# Patient Record
Sex: Female | Born: 1950 | Race: White | Hispanic: No | Marital: Single | Smoking: Former smoker
Health system: Southern US, Community
[De-identification: ages and names within clinical notes are randomized; demographics above are authoritative.]

## PROBLEM LIST (undated history)

## (undated) DIAGNOSIS — S82131A Displaced fracture of medial condyle of right tibia, initial encounter for closed fracture: Secondary | ICD-10-CM

## (undated) DIAGNOSIS — E669 Obesity, unspecified: Secondary | ICD-10-CM

## (undated) DIAGNOSIS — S3282XA Multiple fractures of pelvis without disruption of pelvic ring, initial encounter for closed fracture: Secondary | ICD-10-CM

## (undated) DIAGNOSIS — S82841A Displaced bimalleolar fracture of right lower leg, initial encounter for closed fracture: Secondary | ICD-10-CM

## (undated) DIAGNOSIS — M5030 Other cervical disc degeneration, unspecified cervical region: Secondary | ICD-10-CM

## (undated) DIAGNOSIS — S72463A Displaced supracondylar fracture with intracondylar extension of lower end of unspecified femur, initial encounter for closed fracture: Secondary | ICD-10-CM

## (undated) DIAGNOSIS — D649 Anemia, unspecified: Secondary | ICD-10-CM

## (undated) DIAGNOSIS — S92309A Fracture of unspecified metatarsal bone(s), unspecified foot, initial encounter for closed fracture: Secondary | ICD-10-CM

## (undated) HISTORY — PX: ORIF FEMUR FRACTURE: SHX2119

---

## 2011-07-17 DIAGNOSIS — S82841A Displaced bimalleolar fracture of right lower leg, initial encounter for closed fracture: Secondary | ICD-10-CM

## 2011-07-17 DIAGNOSIS — S72463A Displaced supracondylar fracture with intracondylar extension of lower end of unspecified femur, initial encounter for closed fracture: Secondary | ICD-10-CM

## 2011-07-17 DIAGNOSIS — S82131A Displaced fracture of medial condyle of right tibia, initial encounter for closed fracture: Secondary | ICD-10-CM

## 2011-07-17 HISTORY — DX: Displaced bimalleolar fracture of right lower leg, initial encounter for closed fracture: S82.841A

## 2011-07-17 HISTORY — DX: Displaced supracondylar fracture with intracondylar extension of lower end of unspecified femur, initial encounter for closed fracture: S72.463A

## 2011-07-17 HISTORY — DX: Displaced fracture of medial condyle of right tibia, initial encounter for closed fracture: S82.131A

## 2011-07-20 ENCOUNTER — Emergency Department (HOSPITAL_COMMUNITY): Payer: No Typology Code available for payment source

## 2011-07-20 ENCOUNTER — Encounter (HOSPITAL_COMMUNITY): Admission: EM | Disposition: A | Discharge status: Skilled Nursing Facility | Payer: Self-pay | Source: Ambulatory Visit

## 2011-07-20 ENCOUNTER — Encounter (HOSPITAL_COMMUNITY): Payer: Self-pay | Admitting: Certified Registered"

## 2011-07-20 ENCOUNTER — Encounter: Payer: Self-pay | Admitting: Emergency Medicine

## 2011-07-20 ENCOUNTER — Emergency Department (HOSPITAL_COMMUNITY): Payer: No Typology Code available for payment source | Admitting: Certified Registered"

## 2011-07-20 ENCOUNTER — Other Ambulatory Visit: Payer: Self-pay

## 2011-07-20 ENCOUNTER — Inpatient Hospital Stay (HOSPITAL_COMMUNITY)
Admission: EM | Admit: 2011-07-20 | Discharge: 2011-08-13 | DRG: 480 | Disposition: A | Discharge status: Skilled Nursing Facility | Payer: No Typology Code available for payment source | Source: Ambulatory Visit | Attending: General Surgery | Admitting: General Surgery

## 2011-07-20 DIAGNOSIS — S0083XA Contusion of other part of head, initial encounter: Secondary | ICD-10-CM | POA: Diagnosis present

## 2011-07-20 DIAGNOSIS — IMO0002 Reserved for concepts with insufficient information to code with codable children: Secondary | ICD-10-CM | POA: Diagnosis present

## 2011-07-20 DIAGNOSIS — T148XXA Other injury of unspecified body region, initial encounter: Secondary | ICD-10-CM | POA: Diagnosis present

## 2011-07-20 DIAGNOSIS — R600 Localized edema: Secondary | ICD-10-CM

## 2011-07-20 DIAGNOSIS — S42409A Unspecified fracture of lower end of unspecified humerus, initial encounter for closed fracture: Secondary | ICD-10-CM | POA: Diagnosis present

## 2011-07-20 DIAGNOSIS — S82409A Unspecified fracture of shaft of unspecified fibula, initial encounter for closed fracture: Secondary | ICD-10-CM | POA: Diagnosis present

## 2011-07-20 DIAGNOSIS — S82843A Displaced bimalleolar fracture of unspecified lower leg, initial encounter for closed fracture: Secondary | ICD-10-CM | POA: Diagnosis present

## 2011-07-20 DIAGNOSIS — N390 Urinary tract infection, site not specified: Secondary | ICD-10-CM | POA: Diagnosis not present

## 2011-07-20 DIAGNOSIS — S82109A Unspecified fracture of upper end of unspecified tibia, initial encounter for closed fracture: Secondary | ICD-10-CM | POA: Diagnosis present

## 2011-07-20 DIAGNOSIS — S060X9A Concussion with loss of consciousness of unspecified duration, initial encounter: Secondary | ICD-10-CM | POA: Diagnosis present

## 2011-07-20 DIAGNOSIS — S92819A Other fracture of unspecified foot, initial encounter for closed fracture: Secondary | ICD-10-CM

## 2011-07-20 DIAGNOSIS — S81812A Laceration without foreign body, left lower leg, initial encounter: Secondary | ICD-10-CM | POA: Diagnosis present

## 2011-07-20 DIAGNOSIS — H538 Other visual disturbances: Secondary | ICD-10-CM | POA: Diagnosis not present

## 2011-07-20 DIAGNOSIS — S2220XA Unspecified fracture of sternum, initial encounter for closed fracture: Secondary | ICD-10-CM

## 2011-07-20 DIAGNOSIS — S069X9A Unspecified intracranial injury with loss of consciousness of unspecified duration, initial encounter: Secondary | ICD-10-CM

## 2011-07-20 DIAGNOSIS — Z23 Encounter for immunization: Secondary | ICD-10-CM

## 2011-07-20 DIAGNOSIS — S7290XA Unspecified fracture of unspecified femur, initial encounter for closed fracture: Secondary | ICD-10-CM

## 2011-07-20 DIAGNOSIS — S92335A Nondisplaced fracture of third metatarsal bone, left foot, initial encounter for closed fracture: Secondary | ICD-10-CM | POA: Diagnosis present

## 2011-07-20 DIAGNOSIS — S82831A Other fracture of upper and lower end of right fibula, initial encounter for closed fracture: Secondary | ICD-10-CM | POA: Diagnosis present

## 2011-07-20 DIAGNOSIS — S92345A Nondisplaced fracture of fourth metatarsal bone, left foot, initial encounter for closed fracture: Secondary | ICD-10-CM | POA: Diagnosis present

## 2011-07-20 DIAGNOSIS — M5030 Other cervical disc degeneration, unspecified cervical region: Secondary | ICD-10-CM

## 2011-07-20 DIAGNOSIS — R0789 Other chest pain: Secondary | ICD-10-CM | POA: Diagnosis present

## 2011-07-20 DIAGNOSIS — S72351A Displaced comminuted fracture of shaft of right femur, initial encounter for closed fracture: Secondary | ICD-10-CM | POA: Diagnosis present

## 2011-07-20 DIAGNOSIS — S060X1A Concussion with loss of consciousness of 30 minutes or less, initial encounter: Secondary | ICD-10-CM | POA: Diagnosis present

## 2011-07-20 DIAGNOSIS — E669 Obesity, unspecified: Secondary | ICD-10-CM | POA: Diagnosis present

## 2011-07-20 DIAGNOSIS — S0003XA Contusion of scalp, initial encounter: Secondary | ICD-10-CM | POA: Diagnosis present

## 2011-07-20 DIAGNOSIS — S82209A Unspecified fracture of shaft of unspecified tibia, initial encounter for closed fracture: Secondary | ICD-10-CM

## 2011-07-20 DIAGNOSIS — D62 Acute posthemorrhagic anemia: Secondary | ICD-10-CM

## 2011-07-20 DIAGNOSIS — Y998 Other external cause status: Secondary | ICD-10-CM

## 2011-07-20 DIAGNOSIS — S91009A Unspecified open wound, unspecified ankle, initial encounter: Secondary | ICD-10-CM | POA: Diagnosis present

## 2011-07-20 DIAGNOSIS — S92352A Displaced fracture of fifth metatarsal bone, left foot, initial encounter for closed fracture: Secondary | ICD-10-CM | POA: Diagnosis present

## 2011-07-20 DIAGNOSIS — S81009A Unspecified open wound, unspecified knee, initial encounter: Secondary | ICD-10-CM | POA: Diagnosis present

## 2011-07-20 DIAGNOSIS — S82009A Unspecified fracture of unspecified patella, initial encounter for closed fracture: Secondary | ICD-10-CM | POA: Diagnosis present

## 2011-07-20 DIAGNOSIS — S82143A Displaced bicondylar fracture of unspecified tibia, initial encounter for closed fracture: Secondary | ICD-10-CM | POA: Diagnosis present

## 2011-07-20 DIAGNOSIS — S72309A Unspecified fracture of shaft of unspecified femur, initial encounter for closed fracture: Principal | ICD-10-CM | POA: Diagnosis present

## 2011-07-20 DIAGNOSIS — S82841A Displaced bimalleolar fracture of right lower leg, initial encounter for closed fracture: Secondary | ICD-10-CM | POA: Diagnosis present

## 2011-07-20 DIAGNOSIS — M810 Age-related osteoporosis without current pathological fracture: Secondary | ICD-10-CM | POA: Diagnosis present

## 2011-07-20 DIAGNOSIS — S92309A Fracture of unspecified metatarsal bone(s), unspecified foot, initial encounter for closed fracture: Secondary | ICD-10-CM | POA: Diagnosis present

## 2011-07-20 DIAGNOSIS — S069XAA Unspecified intracranial injury with loss of consciousness status unknown, initial encounter: Secondary | ICD-10-CM

## 2011-07-20 DIAGNOSIS — F17200 Nicotine dependence, unspecified, uncomplicated: Secondary | ICD-10-CM | POA: Diagnosis present

## 2011-07-20 DIAGNOSIS — R578 Other shock: Secondary | ICD-10-CM | POA: Diagnosis present

## 2011-07-20 HISTORY — DX: Other cervical disc degeneration, unspecified cervical region: M50.30

## 2011-07-20 HISTORY — PX: I & D EXTREMITY: SHX5045

## 2011-07-20 HISTORY — DX: Anemia, unspecified: D64.9

## 2011-07-20 HISTORY — PX: ORIF FEMUR FRACTURE: SHX2119

## 2011-07-20 HISTORY — DX: Obesity, unspecified: E66.9

## 2011-07-20 LAB — COMPREHENSIVE METABOLIC PANEL
ALT: 17 U/L (ref 0–35)
Alkaline Phosphatase: 52 U/L (ref 39–117)
CO2: 21 mEq/L (ref 19–32)
Chloride: 110 mEq/L (ref 96–112)
GFR calc Af Amer: 76 mL/min — ABNORMAL LOW (ref >90)
Glucose, Bld: 150 mg/dL — ABNORMAL HIGH (ref 70–99)
Potassium: 3.1 mEq/L — ABNORMAL LOW (ref 3.5–5.1)
Sodium: 140 mEq/L (ref 135–145)
Total Bilirubin: 0.2 mg/dL — ABNORMAL LOW (ref 0.3–1.2)
Total Protein: 5.5 g/dL — ABNORMAL LOW (ref 6.0–8.3)

## 2011-07-20 LAB — POCT I-STAT, CHEM 8
BUN: 21 mg/dL (ref 6–23)
Calcium, Ion: 1.01 mmol/L — ABNORMAL LOW (ref 1.12–1.32)
Chloride: 110 mEq/L (ref 96–112)
Creatinine, Ser: 1 mg/dL (ref 0.50–1.10)
Glucose, Bld: 149 mg/dL — ABNORMAL HIGH (ref 70–99)
TCO2: 20 mmol/L (ref 0–100)

## 2011-07-20 LAB — PROTIME-INR
INR: 1.09 (ref 0.00–1.49)
Prothrombin Time: 14.3 s (ref 11.6–15.2)

## 2011-07-20 LAB — CBC
HCT: 33.2 % — ABNORMAL LOW (ref 36.0–46.0)
MCHC: 33.1 g/dL (ref 30.0–36.0)
Platelets: 221 10*3/uL (ref 150–400)
RDW: 12.5 % (ref 11.5–15.5)

## 2011-07-20 LAB — LACTIC ACID, PLASMA: Lactic Acid, Venous: 3.5 mmol/L — ABNORMAL HIGH (ref 0.5–2.2)

## 2011-07-20 LAB — PREPARE RBC (CROSSMATCH)

## 2011-07-20 SURGERY — OPEN REDUCTION INTERNAL FIXATION (ORIF) DISTAL FEMUR FRACTURE
Anesthesia: General | Site: Leg Lower | Laterality: Right | Wound class: Contaminated

## 2011-07-20 MED ORDER — ONDANSETRON HCL 4 MG/2ML IJ SOLN
INTRAMUSCULAR | Status: AC
  Administered 2011-07-20: 4 mg via INTRAVENOUS
  Filled 2011-07-20: qty 2

## 2011-07-20 MED ORDER — TETANUS-DIPHTHERIA TOXOIDS TD 5-2 LFU IM INJ: 0.5000 mL | INJECTION | Freq: Once | INTRAMUSCULAR | Status: DC

## 2011-07-20 MED ORDER — PROPOFOL 10 MG/ML IV EMUL
INTRAVENOUS | Status: DC | PRN
  Administered 2011-07-20: 130 mg via INTRAVENOUS

## 2011-07-20 MED ORDER — ONDANSETRON HCL 4 MG/2ML IJ SOLN
4.0000 mg | Freq: Once | INTRAMUSCULAR | Status: AC
  Administered 2011-07-20: 4 mg via INTRAVENOUS

## 2011-07-20 MED ORDER — CEFAZOLIN SODIUM-DEXTROSE 2-3 GM-% IV SOLR
2.0000 g | Freq: Once | INTRAVENOUS | Status: DC
  Filled 2011-07-20 (×2): qty 50

## 2011-07-20 MED ORDER — FENTANYL CITRATE 0.05 MG/ML IJ SOLN
50.0000 ug | Freq: Once | INTRAMUSCULAR | Status: AC
  Administered 2011-07-20: 50 ug via INTRAVENOUS

## 2011-07-20 MED ORDER — FENTANYL CITRATE 0.05 MG/ML IJ SOLN
INTRAMUSCULAR | Status: AC
  Administered 2011-07-20: 100 ug via INTRAVENOUS
  Filled 2011-07-20: qty 2

## 2011-07-20 MED ORDER — IOHEXOL 300 MG/ML  SOLN
100.0000 mL | Freq: Once | INTRAMUSCULAR | Status: AC | PRN
  Administered 2011-07-20: 100 mL via INTRAVENOUS

## 2011-07-20 MED ORDER — LACTATED RINGERS IV SOLN
INTRAVENOUS | Status: DC | PRN
  Administered 2011-07-20 – 2011-07-21 (×2): via INTRAVENOUS

## 2011-07-20 MED ORDER — SUCCINYLCHOLINE CHLORIDE 20 MG/ML IJ SOLN
INTRAMUSCULAR | Status: DC | PRN
  Administered 2011-07-20: 100 mg via INTRAVENOUS

## 2011-07-20 MED ORDER — SUFENTANIL CITRATE 50 MCG/ML IV SOLN
INTRAVENOUS | Status: DC | PRN
  Administered 2011-07-20: 10 ug via INTRAVENOUS
  Administered 2011-07-20: 20 ug via INTRAVENOUS
  Administered 2011-07-21: 10 ug via INTRAVENOUS

## 2011-07-20 MED ORDER — HETASTARCH-ELECTROLYTES 6 % IV SOLN
INTRAVENOUS | Status: DC | PRN
  Administered 2011-07-20: via INTRAVENOUS

## 2011-07-20 MED ORDER — TETANUS-DIPHTH-ACELL PERTUSSIS 5-2.5-18.5 LF-MCG/0.5 IM SUSP
0.5000 mL | Freq: Once | INTRAMUSCULAR | Status: AC
  Administered 2011-07-20: 0.5 mL via INTRAMUSCULAR
  Filled 2011-07-20: qty 0.5

## 2011-07-20 MED ORDER — FENTANYL CITRATE 0.05 MG/ML IJ SOLN
50.0000 ug | Freq: Once | INTRAMUSCULAR | Status: AC
  Administered 2011-07-20: 100 ug via INTRAVENOUS

## 2011-07-20 SURGICAL SUPPLY — 49 items
BANDAGE CONFORM 2  STR LF (GAUZE/BANDAGES/DRESSINGS) ×12 IMPLANT
BANDAGE CONFORM 2X5YD N/S (GAUZE/BANDAGES/DRESSINGS) ×3 IMPLANT
BANDAGE ELASTIC 6 VELCRO ST LF (GAUZE/BANDAGES/DRESSINGS) ×6 IMPLANT
BIOIMPLANT ORTHADAPT 9CMX10CM (Orthopedic Implant) ×6 IMPLANT
BNDG COHESIVE 4X5 TAN STRL (GAUZE/BANDAGES/DRESSINGS) ×3 IMPLANT
CLAMP 2 3/5OST (Clamp) ×12 IMPLANT
CLAMP 5 HOLE (Clamp) ×6 IMPLANT
CLAMP ROD-ROD (Clamp) ×12 IMPLANT
CLOTH BEACON ORANGE TIMEOUT ST (SAFETY) ×3 IMPLANT
DRAPE C-ARM 42X72 X-RAY (DRAPES) ×3 IMPLANT
DRAPE EXTREMITY T 121X128X90 (DRAPE) ×3 IMPLANT
DRAPE PROXIMA HALF (DRAPES) ×3 IMPLANT
DRILL BIT ×3 IMPLANT
DRSG PAD ABDOMINAL 8X10 ST (GAUZE/BANDAGES/DRESSINGS) ×3 IMPLANT
ELECT CAUTERY BLADE 6.4 (BLADE) ×3 IMPLANT
ELECT REM PT RETURN 9FT ADLT (ELECTROSURGICAL) ×3
ELECTRODE REM PT RTRN 9FT ADLT (ELECTROSURGICAL) ×2 IMPLANT
GAUZE KERLIX 2  STERILE LF (GAUZE/BANDAGES/DRESSINGS) ×3 IMPLANT
GAUZE XEROFORM 1X8 LF (GAUZE/BANDAGES/DRESSINGS) ×9 IMPLANT
GLOVE BIOGEL M 7.0 STRL (GLOVE) ×6 IMPLANT
GLOVE BIOGEL PI IND STRL 7.5 (GLOVE) ×2 IMPLANT
GLOVE BIOGEL PI INDICATOR 7.5 (GLOVE) ×1
GLOVE SURG SS PI 8.0 STRL IVOR (GLOVE) ×6 IMPLANT
GOWN PREVENTION PLUS XLARGE (GOWN DISPOSABLE) ×9 IMPLANT
KIT BASIN OR (CUSTOM PROCEDURE TRAY) ×3 IMPLANT
KIT ROOM TURNOVER OR (KITS) ×3 IMPLANT
MANIFOLD NEPTUNE II (INSTRUMENTS) ×3 IMPLANT
NEEDLE HYPO 25GX1X1/2 BEV (NEEDLE) ×3 IMPLANT
NS IRRIG 1000ML POUR BTL (IV SOLUTION) ×3 IMPLANT
PACK GENERAL/GYN (CUSTOM PROCEDURE TRAY) ×3 IMPLANT
PAD ARMBOARD 7.5X6 YLW CONV (MISCELLANEOUS) ×6 IMPLANT
SCREW BIOMET (Screw) ×6 IMPLANT
SCREW BONE SELF DRILL/TAP5X150 (Screw) ×6 IMPLANT
SPLINT FIBERGLASS 4X30 (CAST SUPPLIES) ×3 IMPLANT
SPONGE GAUZE 4X4 12PLY (GAUZE/BANDAGES/DRESSINGS) ×6 IMPLANT
STAPLER VISISTAT 35W (STAPLE) ×3 IMPLANT
STOCKINETTE IMPERVIOUS 9X36 MD (GAUZE/BANDAGES/DRESSINGS) ×3 IMPLANT
SUT ETHILON 3 0 FSL (SUTURE) ×3 IMPLANT
SUT VIC AB 0 CT1 27 (SUTURE) ×1
SUT VIC AB 0 CT1 27XBRD ANBCTR (SUTURE) ×2 IMPLANT
SUT VIC AB 1 CT1 27 (SUTURE) ×1
SUT VIC AB 1 CT1 27XBRD ANBCTR (SUTURE) ×2 IMPLANT
SUT VIC AB 2-0 CT1 27 (SUTURE) ×1
SUT VIC AB 2-0 CT1 TAPERPNT 27 (SUTURE) ×2 IMPLANT
SYR CONTROL 10ML LL (SYRINGE) ×3 IMPLANT
TOWEL OR 17X24 6PK STRL BLUE (TOWEL DISPOSABLE) ×6 IMPLANT
TOWEL OR 17X26 10 PK STRL BLUE (TOWEL DISPOSABLE) ×3 IMPLANT
UNDERPAD 30X30 INCONTINENT (UNDERPADS AND DIAPERS) ×3 IMPLANT
WATER STERILE IRR 1000ML POUR (IV SOLUTION) ×6 IMPLANT

## 2011-07-20 NOTE — ED Notes (Signed)
Dr Shelle Iron at bedside to assess pt
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
## 2011-07-20 NOTE — ED Notes (Signed)
c-collar chaged for philly collar
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
## 2011-07-20 NOTE — ED Notes (Signed)
Pt in CT, accompanied by RN.

## 2011-07-21 ENCOUNTER — Inpatient Hospital Stay (HOSPITAL_COMMUNITY): Payer: No Typology Code available for payment source

## 2011-07-21 ENCOUNTER — Encounter (HOSPITAL_COMMUNITY): Payer: Self-pay | Admitting: Certified Registered Nurse Anesthetist

## 2011-07-21 DIAGNOSIS — S92345A Nondisplaced fracture of fourth metatarsal bone, left foot, initial encounter for closed fracture: Secondary | ICD-10-CM | POA: Diagnosis present

## 2011-07-21 DIAGNOSIS — S82841A Displaced bimalleolar fracture of right lower leg, initial encounter for closed fracture: Secondary | ICD-10-CM | POA: Diagnosis present

## 2011-07-21 DIAGNOSIS — IMO0002 Reserved for concepts with insufficient information to code with codable children: Secondary | ICD-10-CM | POA: Diagnosis present

## 2011-07-21 LAB — URINALYSIS, MICROSCOPIC ONLY
Bilirubin Urine: NEGATIVE
Leukocytes, UA: NEGATIVE
Nitrite: NEGATIVE
Protein, ur: NEGATIVE mg/dL
Urobilinogen, UA: 0.2 mg/dL (ref 0.0–1.0)

## 2011-07-21 LAB — BASIC METABOLIC PANEL
CO2: 20 mEq/L (ref 19–32)
Chloride: 115 mEq/L — ABNORMAL HIGH (ref 96–112)
Potassium: 3.9 mEq/L (ref 3.5–5.1)
Sodium: 140 mEq/L (ref 135–145)

## 2011-07-21 LAB — CBC
HCT: 22.3 % — ABNORMAL LOW (ref 36.0–46.0)
Hemoglobin: 7.5 g/dL — ABNORMAL LOW (ref 12.0–15.0)
MCV: 91.4 fL (ref 78.0–100.0)
Platelets: 110 10*3/uL — ABNORMAL LOW (ref 150–400)
RBC: 2.44 MIL/uL — ABNORMAL LOW (ref 3.87–5.11)
WBC: 9.3 10*3/uL (ref 4.0–10.5)

## 2011-07-21 LAB — PREPARE RBC (CROSSMATCH)

## 2011-07-21 LAB — MRSA PCR SCREENING: MRSA by PCR: NEGATIVE

## 2011-07-21 MED ORDER — MORPHINE SULFATE (PF) 1 MG/ML IV SOLN
INTRAVENOUS | Status: DC
  Administered 2011-07-22: 1.5 mg via INTRAVENOUS
  Administered 2011-07-22: 3 mg via INTRAVENOUS
  Administered 2011-07-22 (×2): 1 mg via INTRAVENOUS
  Administered 2011-07-23: 3 mg via INTRAVENOUS
  Administered 2011-07-23: 7.5 mg via INTRAVENOUS
  Administered 2011-07-23: 1.5 mg via INTRAVENOUS
  Administered 2011-07-24: 09:00:00 via INTRAVENOUS
  Administered 2011-07-24: 1.5 mg via INTRAVENOUS
  Filled 2011-07-21: qty 25

## 2011-07-21 MED ORDER — HYDROMORPHONE HCL PF 1 MG/ML IJ SOLN: 0.2500 mg | INTRAMUSCULAR | Status: DC | PRN

## 2011-07-21 MED ORDER — ONDANSETRON HCL 4 MG/2ML IJ SOLN: 4.0000 mg | Freq: Four times a day (QID) | INTRAMUSCULAR | Status: DC | PRN

## 2011-07-21 MED ORDER — DEXTROSE-NACL 5-0.45 % IV SOLN
INTRAVENOUS | Status: DC
  Administered 2011-07-21: 125 mL/h via INTRAVENOUS
  Administered 2011-07-21 – 2011-07-22 (×3): via INTRAVENOUS
  Administered 2011-07-24: 1000 mL via INTRAVENOUS
  Administered 2011-07-27 – 2011-07-29 (×3): via INTRAVENOUS

## 2011-07-21 MED ORDER — DEXTROSE 5 % IV SOLN: 1.0000 g | INTRAVENOUS | Status: DC | PRN

## 2011-07-21 MED ORDER — NALOXONE HCL 0.4 MG/ML IJ SOLN: 0.4000 mg | INTRAMUSCULAR | Status: DC | PRN

## 2011-07-21 MED ORDER — BIOTENE DRY MOUTH MT LIQD
15.0000 mL | Freq: Two times a day (BID) | OROMUCOSAL | Status: DC
  Administered 2011-07-21 – 2011-08-11 (×30): 15 mL via OROMUCOSAL

## 2011-07-21 MED ORDER — PANTOPRAZOLE SODIUM 40 MG PO TBEC: 40.0000 mg | DELAYED_RELEASE_TABLET | Freq: Every day | ORAL | Status: DC

## 2011-07-21 MED ORDER — SODIUM CHLORIDE 0.9 % IV SOLN
INTRAVENOUS | Status: DC | PRN
  Administered 2011-07-21: via INTRAVENOUS

## 2011-07-21 MED ORDER — ONDANSETRON HCL 4 MG PO TABS: 4.0000 mg | ORAL_TABLET | Freq: Four times a day (QID) | ORAL | Status: DC | PRN

## 2011-07-21 MED ORDER — ONDANSETRON HCL 4 MG/2ML IJ SOLN
INTRAMUSCULAR | Status: DC | PRN
  Administered 2011-07-21: 4 mg via INTRAVENOUS

## 2011-07-21 MED ORDER — DEXAMETHASONE SODIUM PHOSPHATE 4 MG/ML IJ SOLN
INTRAMUSCULAR | Status: DC | PRN
  Administered 2011-07-21: 4 mg via INTRAVENOUS

## 2011-07-21 MED ORDER — DIPHENHYDRAMINE HCL 50 MG/ML IJ SOLN: 12.5000 mg | Freq: Four times a day (QID) | INTRAMUSCULAR | Status: DC | PRN

## 2011-07-21 MED ORDER — POTASSIUM CHLORIDE 20 MEQ/15ML (10%) PO LIQD
ORAL | Status: AC
  Filled 2011-07-21: qty 15

## 2011-07-21 MED ORDER — DIPHENHYDRAMINE HCL 12.5 MG/5ML PO ELIX
12.5000 mg | ORAL_SOLUTION | Freq: Four times a day (QID) | ORAL | Status: DC | PRN
  Filled 2011-07-21: qty 5

## 2011-07-21 MED ORDER — ENOXAPARIN SODIUM 30 MG/0.3ML ~~LOC~~ SOLN
30.0000 mg | Freq: Two times a day (BID) | SUBCUTANEOUS | Status: DC
  Administered 2011-07-21 (×2): 30 mg via SUBCUTANEOUS
  Filled 2011-07-21 (×5): qty 0.3

## 2011-07-21 MED ORDER — CEFAZOLIN SODIUM-DEXTROSE 2-3 GM-% IV SOLR
2.0000 g | Freq: Three times a day (TID) | INTRAVENOUS | Status: AC
  Administered 2011-07-21 – 2011-07-23 (×6): 2 g via INTRAVENOUS
  Filled 2011-07-21 (×8): qty 50

## 2011-07-21 MED ORDER — HYDROCODONE-ACETAMINOPHEN 5-325 MG PO TABS
1.0000 | ORAL_TABLET | Freq: Four times a day (QID) | ORAL | Status: DC | PRN
  Administered 2011-07-24 – 2011-07-25 (×4): 2 via ORAL
  Administered 2011-07-26: 1 via ORAL
  Administered 2011-07-26: 2 via ORAL
  Administered 2011-07-26: 1 via ORAL
  Administered 2011-07-27: 2 via ORAL
  Administered 2011-07-28: 1 via ORAL
  Administered 2011-07-28: 2 via ORAL
  Administered 2011-07-28: 1 via ORAL
  Administered 2011-07-28 – 2011-08-03 (×8): 2 via ORAL
  Filled 2011-07-21 (×9): qty 2
  Filled 2011-07-21: qty 1
  Filled 2011-07-21: qty 2
  Filled 2011-07-21: qty 1
  Filled 2011-07-21 (×5): qty 2
  Filled 2011-07-21: qty 1
  Filled 2011-07-21 (×2): qty 2

## 2011-07-21 MED ORDER — CEFAZOLIN SODIUM 1-5 GM-% IV SOLN: 1.0000 g | Freq: Once | INTRAVENOUS | Status: DC

## 2011-07-21 MED ORDER — CEFAZOLIN SODIUM-DEXTROSE 2-3 GM-% IV SOLR: 2.0000 g | Freq: Four times a day (QID) | INTRAVENOUS | Status: DC

## 2011-07-21 MED ORDER — MORPHINE SULFATE 4 MG/ML IJ SOLN
4.0000 mg | INTRAMUSCULAR | Status: DC | PRN
  Administered 2011-07-21 – 2011-07-30 (×19): 4 mg via INTRAVENOUS
  Filled 2011-07-21 (×20): qty 1

## 2011-07-21 MED ORDER — PANTOPRAZOLE SODIUM 40 MG IV SOLR
40.0000 mg | Freq: Every day | INTRAVENOUS | Status: DC
  Filled 2011-07-21: qty 40

## 2011-07-21 MED ORDER — ACETAMINOPHEN 325 MG PO TABS
325.0000 mg | ORAL_TABLET | Freq: Four times a day (QID) | ORAL | Status: DC | PRN
  Administered 2011-07-31: 325 mg via ORAL
  Filled 2011-07-21: qty 2

## 2011-07-21 MED ORDER — SODIUM CHLORIDE 0.9 % IJ SOLN: 9.0000 mL | INTRAMUSCULAR | Status: DC | PRN

## 2011-07-21 MED ORDER — CEFAZOLIN SODIUM 1-5 GM-% IV SOLN
INTRAVENOUS | Status: DC | PRN
  Administered 2011-07-20: 2 g via INTRAVENOUS

## 2011-07-21 MED ORDER — PROMETHAZINE HCL 25 MG/ML IJ SOLN: 6.2500 mg | INTRAMUSCULAR | Status: DC | PRN

## 2011-07-21 MED ORDER — EPHEDRINE SULFATE 50 MG/ML IJ SOLN
INTRAMUSCULAR | Status: DC | PRN
  Administered 2011-07-21 (×2): 10 mg via INTRAVENOUS

## 2011-07-21 MED ORDER — POTASSIUM CHLORIDE 20 MEQ/15ML (10%) PO LIQD
ORAL | Status: AC
  Administered 2011-07-21: 20 meq
  Filled 2011-07-21: qty 15

## 2011-07-21 MED ORDER — BUPIVACAINE-EPINEPHRINE PF 0.25-1:200000 % IJ SOLN
INTRAMUSCULAR | Status: DC | PRN
  Administered 2011-07-21: 10 mL

## 2011-07-21 MED ORDER — LACTATED RINGERS IV SOLN: INTRAVENOUS | Status: DC

## 2011-07-21 MED ORDER — MORPHINE SULFATE 4 MG/ML IJ SOLN
3.0000 mg | INTRAMUSCULAR | Status: DC | PRN
  Administered 2011-07-21: 3 mg via INTRAVENOUS
  Filled 2011-07-21 (×2): qty 1
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
## 2011-07-21 NOTE — Progress Notes (Signed)
Pt. With order to transfer 5028. Report given to [facility]. Pt's. V/S stable; with ongoing #1 unit PRBC & D51/2 NS @125ml /r. No c/o made at this time. Transferred.

## 2011-07-21 NOTE — Brief Op Note (Signed)
07/20/2011 - 07/21/2011  1:28 AM  PATIENT:  Regina Rosales  60 y.o. female  PRE-OPERATIVE DIAGNOSIS:  Right distal femur fracture, Right proximal tibia fracture Laceration right calf leftcalf. Right ankle fracture  POST-OPERATIVE DIAGNOSIS:  Closed reduction with applicaton of external fixation and irrigation and debridment of multiple leg wounds and multiple laceration repair and closure.  PROCEDURE:  Procedure(s): OPEN REDUCTION INTERNAL FIXATION (ORIF) DISTAL FEMUR FRACTURE IRRIGATION AND DEBRIDEMENT EXTREMITY  SURGEON:  Surgeon(s): Marshall Cork Dayana Dalporto  PHYSICIAN ASSISTANT:   ASSISTANTS: none   ANESTHESIA:   general  EBL:  Total I/O In: 4850 [I.V.:4000; Blood:350; IV Piggyback:500] Out: 400 [Urine:400]  BLOOD ADMINISTERED:none  DRAINS: none   LOCAL MEDICATIONS USED:  NONE  SPECIMEN:  No Specimen  DISPOSITION OF SPECIMEN:  N/A  COUNTS:  YES  TOURNIQUET:  * No tourniquets in log *  DICTATION: . Number  [id number]  PLAN OF CARE: Admit to inpatient   PATIENT DISPOSITION:  PACU - hemodynamically stable.   Delay start of Pharmacological VTE agent (>24hrs) due to surgical blood loss or risk of bleeding:  {YES/NO/NOT APPLICABLE:20182

## 2011-07-21 NOTE — Progress Notes (Signed)
Transfuse today.    Wilmon Arms. Corliss Skains, MD, [facility] Surgery  07/21/2011 12:20 PM

## 2011-07-21 NOTE — Progress Notes (Signed)
Passed on info of patient's family/friends to patient's nurse in 2900 around 9:10 a.m.

## 2011-07-21 NOTE — Transfer of Care (Signed)
Immediate Anesthesia Transfer of Care Note  Patient: Regina Rosales  Procedure(s) Performed:  OPEN REDUCTION INTERNAL FIXATION (ORIF) DISTAL FEMUR FRACTURE - irrigation of multiple lacerations with reapir; IRRIGATION AND DEBRIDEMENT EXTREMITY - closure of laceration  Patient Location: PACU  Anesthesia Type: General  Level of Consciousness: oriented and sedated  Airway & Oxygen Therapy: Patient Spontanous Breathing and Patient connected to nasal cannula oxygen  Post-op Assessment: Report given to PACU RN, Post -op Vital signs reviewed and stable and Patient moving all extremities  Post vital signs: Reviewed and stable  Complications: No apparent anesthesia complications

## 2011-07-21 NOTE — Anesthesia Postprocedure Evaluation (Signed)
  Anesthesia Post-op Note  Patient: Regina Rosales  Procedure(s) Performed:  OPEN REDUCTION INTERNAL FIXATION (ORIF) DISTAL FEMUR FRACTURE - irrigation of multiple lacerations with reapir; IRRIGATION AND DEBRIDEMENT EXTREMITY - closure of laceration  Patient Location: PACU  Anesthesia Type: General  Level of Consciousness: sedated  Airway and Oxygen Therapy: Patient Spontanous Breathing and Patient connected to nasal cannula oxygen  Post-op Pain: none  Post-op Assessment: Post-op Vital signs reviewed, Patient's Cardiovascular Status Stable, Respiratory Function Stable, Patent Airway, No signs of Nausea or vomiting and Pain level controlled  Post-op Vital Signs: Reviewed and stable  Complications: No apparent anesthesia complications
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
## 2011-07-21 NOTE — Progress Notes (Signed)
Pt. Was instructed on peak flow and achieved a goal of 60 pre Xopenex tx. And a goal of 100  post tx.
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
## 2011-07-22 ENCOUNTER — Inpatient Hospital Stay (HOSPITAL_COMMUNITY): Payer: No Typology Code available for payment source | Admitting: Anesthesiology

## 2011-07-22 ENCOUNTER — Encounter (HOSPITAL_COMMUNITY): Payer: Self-pay | Admitting: Specialist

## 2011-07-22 ENCOUNTER — Encounter (HOSPITAL_COMMUNITY): Payer: Self-pay | Admitting: Anesthesiology

## 2011-07-22 ENCOUNTER — Inpatient Hospital Stay (HOSPITAL_COMMUNITY): Payer: No Typology Code available for payment source

## 2011-07-22 ENCOUNTER — Encounter (HOSPITAL_COMMUNITY): Admission: EM | Disposition: A | Discharge status: Skilled Nursing Facility | Payer: Self-pay | Source: Ambulatory Visit

## 2011-07-22 HISTORY — PX: EXTERNAL FIXATION LEG: SHX1549

## 2011-07-22 LAB — BASIC METABOLIC PANEL
Chloride: 111 mEq/L (ref 96–112)
GFR calc Af Amer: >90 mL/min (ref >90)
GFR calc non Af Amer: >90 mL/min (ref >90)
Potassium: 4 mEq/L (ref 3.5–5.1)
Sodium: 138 mEq/L (ref 135–145)

## 2011-07-22 LAB — TYPE AND SCREEN
Unit division: 0
Unit division: 0

## 2011-07-22 LAB — CBC
HCT: 27.4 % — ABNORMAL LOW (ref 36.0–46.0)
Hemoglobin: 9.4 g/dL — ABNORMAL LOW (ref 12.0–15.0)
MCHC: 34.3 g/dL (ref 30.0–36.0)
RDW: 14.4 % (ref 11.5–15.5)
WBC: 10.8 10*3/uL — ABNORMAL HIGH (ref 4.0–10.5)

## 2011-07-22 SURGERY — EXTERNAL FIXATION, LOWER EXTREMITY
Anesthesia: General | Site: Leg Lower | Laterality: Right | Wound class: Clean

## 2011-07-22 MED ORDER — METOCLOPRAMIDE HCL 5 MG/ML IJ SOLN
INTRAMUSCULAR | Status: DC | PRN
  Administered 2011-07-22: 10 mg via INTRAVENOUS

## 2011-07-22 MED ORDER — DEXTROSE 5 % IV SOLN
INTRAVENOUS | Status: DC | PRN
  Administered 2011-07-22: 17:00:00 via INTRAVENOUS

## 2011-07-22 MED ORDER — HYDROMORPHONE HCL PF 1 MG/ML IJ SOLN: 0.2500 mg | INTRAMUSCULAR | Status: DC | PRN

## 2011-07-22 MED ORDER — GLYCOPYRROLATE 0.2 MG/ML IJ SOLN
INTRAMUSCULAR | Status: DC | PRN
  Administered 2011-07-22: .6 mg via INTRAVENOUS

## 2011-07-22 MED ORDER — SODIUM CHLORIDE 0.9 % IR SOLN
Status: DC | PRN
  Administered 2011-07-22: 1000 mL

## 2011-07-22 MED ORDER — FENTANYL CITRATE 0.05 MG/ML IJ SOLN
INTRAMUSCULAR | Status: DC | PRN
  Administered 2011-07-22: 50 ug via INTRAVENOUS
  Administered 2011-07-22 (×3): 100 ug via INTRAVENOUS
  Administered 2011-07-22: 50 ug via INTRAVENOUS

## 2011-07-22 MED ORDER — PROPOFOL 10 MG/ML IV EMUL
INTRAVENOUS | Status: DC | PRN
  Administered 2011-07-22: 40 mg via INTRAVENOUS
  Administered 2011-07-22: 30 mg via INTRAVENOUS
  Administered 2011-07-22: 130 mg via INTRAVENOUS

## 2011-07-22 MED ORDER — MIDAZOLAM HCL 5 MG/5ML IJ SOLN
INTRAMUSCULAR | Status: DC | PRN
  Administered 2011-07-22: 2 mg via INTRAVENOUS

## 2011-07-22 MED ORDER — DEXAMETHASONE SODIUM PHOSPHATE 4 MG/ML IJ SOLN
INTRAMUSCULAR | Status: DC | PRN
  Administered 2011-07-22: 8 mg via INTRAVENOUS

## 2011-07-22 MED ORDER — ONDANSETRON HCL 4 MG/2ML IJ SOLN
INTRAMUSCULAR | Status: DC | PRN
  Administered 2011-07-22 (×2): 4 mg via INTRAVENOUS

## 2011-07-22 MED ORDER — LACTATED RINGERS IV SOLN
INTRAVENOUS | Status: DC
  Administered 2011-07-22: 15:00:00 via INTRAVENOUS

## 2011-07-22 MED ORDER — ONDANSETRON HCL 4 MG/2ML IJ SOLN: 4.0000 mg | Freq: Once | INTRAMUSCULAR | Status: DC | PRN

## 2011-07-22 MED ORDER — CEFAZOLIN SODIUM 1-5 GM-% IV SOLN
INTRAVENOUS | Status: DC | PRN
  Administered 2011-07-22: 2 g via INTRAVENOUS

## 2011-07-22 MED ORDER — ROCURONIUM BROMIDE 100 MG/10ML IV SOLN
INTRAVENOUS | Status: DC | PRN
  Administered 2011-07-22: 10 mg via INTRAVENOUS
  Administered 2011-07-22: 40 mg via INTRAVENOUS

## 2011-07-22 MED ORDER — LIDOCAINE HCL (CARDIAC) 20 MG/ML IV SOLN
INTRAVENOUS | Status: DC | PRN
  Administered 2011-07-22: 80 mg via INTRAVENOUS

## 2011-07-22 MED ORDER — VECURONIUM BROMIDE 10 MG IV SOLR
INTRAVENOUS | Status: DC | PRN
  Administered 2011-07-22: 4 mg via INTRAVENOUS

## 2011-07-22 MED ORDER — NEOSTIGMINE METHYLSULFATE 1 MG/ML IJ SOLN
INTRAMUSCULAR | Status: DC | PRN
  Administered 2011-07-22: 3 mg via INTRAVENOUS

## 2011-07-22 MED ORDER — PHENYLEPHRINE HCL 10 MG/ML IJ SOLN
10.0000 mg | INTRAMUSCULAR | Status: DC | PRN
  Administered 2011-07-22: 10 ug/min via INTRAVENOUS

## 2011-07-22 MED ORDER — LACTATED RINGERS IV SOLN: INTRAVENOUS | Status: DC

## 2011-07-22 MED ORDER — LACTATED RINGERS IV SOLN
INTRAVENOUS | Status: DC | PRN
  Administered 2011-07-22 (×2): via INTRAVENOUS

## 2011-07-22 MED ORDER — DROPERIDOL 2.5 MG/ML IJ SOLN
INTRAMUSCULAR | Status: DC | PRN
  Administered 2011-07-22: 0.625 mg via INTRAVENOUS

## 2011-07-22 SURGICAL SUPPLY — 80 items
AIRCAST, LARGE RIGHT, ANKLE BRACE ×4 IMPLANT
BANDAGE ELASTIC 4 VELCRO ST LF (GAUZE/BANDAGES/DRESSINGS) ×4 IMPLANT
BANDAGE ELASTIC 6 VELCRO ST LF (GAUZE/BANDAGES/DRESSINGS) IMPLANT
BANDAGE ESMARK 6X9 LF (GAUZE/BANDAGES/DRESSINGS) IMPLANT
BANDAGE GAUZE ELAST BULKY 4 IN (GAUZE/BANDAGES/DRESSINGS) ×4 IMPLANT
BLADE SURG 10 STRL SS (BLADE) IMPLANT
BNDG COHESIVE 4X5 TAN STRL (GAUZE/BANDAGES/DRESSINGS) IMPLANT
BNDG COHESIVE 6X5 TAN STRL LF (GAUZE/BANDAGES/DRESSINGS) IMPLANT
BNDG ELASTIC 6X15 VLCR STRL LF (GAUZE/BANDAGES/DRESSINGS) ×4 IMPLANT
BNDG ESMARK 6X9 LF (GAUZE/BANDAGES/DRESSINGS)
BRUSH SCRUB DISP (MISCELLANEOUS) ×8 IMPLANT
CLEANER TIP ELECTROSURG 2X2 (MISCELLANEOUS) IMPLANT
CLOTH BEACON ORANGE TIMEOUT ST (SAFETY) ×4 IMPLANT
COVER SURGICAL LIGHT HANDLE (MISCELLANEOUS) ×8 IMPLANT
CUFF TOURNIQUET SINGLE 18IN (TOURNIQUET CUFF) IMPLANT
CUFF TOURNIQUET SINGLE 24IN (TOURNIQUET CUFF) IMPLANT
CUFF TOURNIQUET SINGLE 34IN LL (TOURNIQUET CUFF) IMPLANT
DRAPE C-ARM 42X72 X-RAY (DRAPES) ×4 IMPLANT
DRAPE C-ARMOR (DRAPES) ×4 IMPLANT
DRAPE ORTHO SPLIT 77X108 STRL (DRAPES)
DRAPE PROXIMA HALF (DRAPES) IMPLANT
DRAPE SURG ORHT 6 SPLT 77X108 (DRAPES) IMPLANT
DRAPE U-SHAPE 47X51 STRL (DRAPES) ×4 IMPLANT
DRSG ADAPTIC 3X8 NADH LF (GAUZE/BANDAGES/DRESSINGS) ×4 IMPLANT
DRSG EMULSION OIL 3X3 NADH (GAUZE/BANDAGES/DRESSINGS) IMPLANT
DRSG PAD ABDOMINAL 8X10 ST (GAUZE/BANDAGES/DRESSINGS) ×4 IMPLANT
DRSG XEROFORM 1X8 (GAUZE/BANDAGES/DRESSINGS) ×8 IMPLANT
ELECT REM PT RETURN 9FT ADLT (ELECTROSURGICAL) ×4
ELECTRODE REM PT RTRN 9FT ADLT (ELECTROSURGICAL) ×3 IMPLANT
EVACUATOR 1/8 PVC DRAIN (DRAIN) IMPLANT
GAUZE XEROFORM 1X8 LF (GAUZE/BANDAGES/DRESSINGS) ×4 IMPLANT
GLOVE BIO SURGEON STRL SZ7.5 (GLOVE) ×4 IMPLANT
GLOVE BIO SURGEON STRL SZ8 (GLOVE) ×4 IMPLANT
GLOVE BIOGEL PI IND STRL 7.5 (GLOVE) ×3 IMPLANT
GLOVE BIOGEL PI IND STRL 8 (GLOVE) ×3 IMPLANT
GLOVE BIOGEL PI INDICATOR 7.5 (GLOVE) ×1
GLOVE BIOGEL PI INDICATOR 8 (GLOVE) ×1
GOWN PREVENTION PLUS XLARGE (GOWN DISPOSABLE) ×4 IMPLANT
GOWN STRL NON-REIN LRG LVL3 (GOWN DISPOSABLE) ×4 IMPLANT
HANDPIECE INTERPULSE COAX TIP (DISPOSABLE)
KIT BASIN OR (CUSTOM PROCEDURE TRAY) ×4 IMPLANT
KIT REMOVER STAPLE SKIN (MISCELLANEOUS) ×4 IMPLANT
KIT ROOM TURNOVER OR (KITS) ×4 IMPLANT
MANIFOLD NEPTUNE II (INSTRUMENTS) ×4 IMPLANT
NEEDLE 22X1 1/2 (OR ONLY) (NEEDLE) IMPLANT
NEEDLE HYPO 21X1.5 SAFETY (NEEDLE) IMPLANT
NS IRRIG 1000ML POUR BTL (IV SOLUTION) ×4 IMPLANT
PACK GENERAL/GYN (CUSTOM PROCEDURE TRAY) IMPLANT
PACK ORTHO EXTREMITY (CUSTOM PROCEDURE TRAY) ×4 IMPLANT
PAD ARMBOARD 7.5X6 YLW CONV (MISCELLANEOUS) ×8 IMPLANT
PAD CAST 4YDX4 CTTN HI CHSV (CAST SUPPLIES) IMPLANT
PADDING CAST COTTON 4X4 STRL (CAST SUPPLIES)
PADDING CAST COTTON 6X4 STRL (CAST SUPPLIES) IMPLANT
PENCIL BUTTON HOLSTER BLD 10FT (ELECTRODE) IMPLANT
SCREW ×4 IMPLANT
SET HNDPC FAN SPRY TIP SCT (DISPOSABLE) IMPLANT
SPONGE GAUZE 4X4 12PLY (GAUZE/BANDAGES/DRESSINGS) ×12 IMPLANT
SPONGE LAP 18X18 X RAY DECT (DISPOSABLE) ×8 IMPLANT
SPONGE SCRUB IODOPHOR (GAUZE/BANDAGES/DRESSINGS) ×8 IMPLANT
STAPLER VISISTAT 35W (STAPLE) ×4 IMPLANT
STOCKINETTE IMPERVIOUS LG (DRAPES) IMPLANT
STRIP CLOSURE SKIN 1/2X4 (GAUZE/BANDAGES/DRESSINGS) IMPLANT
SUCTION FRAZIER TIP 10 FR DISP (SUCTIONS) ×4 IMPLANT
SUT ETHILON 2 0 FS 18 (SUTURE) IMPLANT
SUT ETHILON 3 0 PS 1 (SUTURE) ×8 IMPLANT
SUT PDS 2 0 (SUTURE) ×4 IMPLANT
SUT PDS AB 2-0 CT1 27 (SUTURE) IMPLANT
SUT VIC AB 0 CT1 27 (SUTURE)
SUT VIC AB 0 CT1 27XBRD ANBCTR (SUTURE) IMPLANT
SUT VIC AB 2-0 CT1 27 (SUTURE)
SUT VIC AB 2-0 CT1 TAPERPNT 27 (SUTURE) IMPLANT
SUT VIC AB 2-0 CT3 27 (SUTURE) IMPLANT
SYR CONTROL 10ML LL (SYRINGE) IMPLANT
TAPE CLOTH SURG 4X10 WHT LF (GAUZE/BANDAGES/DRESSINGS) ×4 IMPLANT
TOWEL OR 17X24 6PK STRL BLUE (TOWEL DISPOSABLE) ×8 IMPLANT
TOWEL OR 17X26 10 PK STRL BLUE (TOWEL DISPOSABLE) ×8 IMPLANT
TUBE CONNECTING 12X1/4 (SUCTIONS) ×4 IMPLANT
UNDERPAD 30X30 INCONTINENT (UNDERPADS AND DIAPERS) ×4 IMPLANT
WATER STERILE IRR 1000ML POUR (IV SOLUTION) ×8 IMPLANT
YANKAUER SUCT BULB TIP NO VENT (SUCTIONS) ×4 IMPLANT

## 2011-07-22 NOTE — Progress Notes (Signed)
Clinical Social Worker received referral for new placement - patient is currently in the OR for an ORIF.  CSW will follow up with patient tomorrow to complete full assessment and discuss possible placement needs.  [address] [place], [place] [phone]

## 2011-07-22 NOTE — Progress Notes (Signed)
Dr Katrinka Blazing here---aware pt needed 8 l o2 by simple mask to get sat. > 90%. Will cont to monitor. xrays done as ord.

## 2011-07-22 NOTE — Transfer of Care (Signed)
Immediate Anesthesia Transfer of Care Note  Patient: Regina Rosales  Procedure(s) Performed:  EXTERNAL FIXATION LEG - external fixation adjustment right leg; DEBRIDEMENT AND CLOSURE WOUND  Patient Location: PACU  Anesthesia Type: General  Level of Consciousness: awake, sedated, patient cooperative and responds to stimulation  Airway & Oxygen Therapy: Patient Spontanous Breathing and Patient connected to face mask oxygen  Post-op Assessment: Report given to PACU RN and Post -op Vital signs reviewed and stable  Post vital signs: Reviewed and stable  Complications: No apparent anesthesia complications

## 2011-07-22 NOTE — Anesthesia Procedure Notes (Signed)
Procedure Name: Intubation Date/Time: 07/22/2011 4:54 PM Performed by: Wray Kearns A Pre-anesthesia Checklist: Patient identified, Timeout performed, Emergency Drugs available, Suction available and Patient being monitored Patient Re-evaluated:Patient Re-evaluated prior to inductionOxygen Delivery Method: Circle System Utilized Preoxygenation: Pre-oxygenation with 100% oxygen Intubation Type: IV induction Ventilation: Mask ventilation without difficulty and Oral airway inserted - appropriate to patient size Laryngoscope Size: Mac and 3 Grade View: Grade I Tube type: Oral Tube size: 7.5 mm Number of attempts: 1 Airway Equipment and Method: stylet Placement Confirmation: ETT inserted through vocal cords under direct vision,  breath sounds checked- equal and bilateral and positive ETCO2 Secured at: 22 cm Tube secured with: Tape Dental Injury: Teeth and Oropharynx as per pre-operative assessment

## 2011-07-22 NOTE — Plan of Care (Signed)
Problem: Phase II Progression Outcomes Goal: Discharge plan established Recommend CIR consult vs SNF for further therapy needs after acute discharge

## 2011-07-22 NOTE — Anesthesia Preprocedure Evaluation (Signed)
Anesthesia Evaluation  Patient identified by MRN, date of birth, ID band Patient awake    Reviewed: Allergy & Precautions, H&P , NPO status , Patient's Chart, lab work & pertinent test results  Airway Mallampati: I TM Distance: >3 FB Neck ROM: Full    Dental   Pulmonary COPD   Pulmonary exam normal       Cardiovascular     Neuro/Psych    GI/Hepatic   Endo/Other    Renal/GU      Musculoskeletal   Abdominal (+) obese,   Peds  Hematology   Anesthesia Other Findings   Reproductive/Obstetrics                           Anesthesia Physical Anesthesia Plan  ASA: II  Anesthesia Plan: General   Post-op Pain Management:    Induction: Intravenous  Airway Management Planned: Oral ETT  Additional Equipment:   Intra-op Plan:   Post-operative Plan: Extubation in OR  Informed Consent: I have reviewed the patients History and Physical, chart, labs and discussed the procedure including the risks, benefits and alternatives for the proposed anesthesia with the patient or authorized representative who has indicated his/her understanding and acceptance.     Plan Discussed with: CRNA and Surgeon  Anesthesia Plan Comments:         Anesthesia Quick Evaluation

## 2011-07-22 NOTE — Progress Notes (Signed)
PT Cancellation Note  Treatment cancelled today due to pt unable to move secondary to increased pain with any movement. History taken from pt. Will attempt evaluation following surgery.  07/22/2011 Milana Kidney DPT PAGER: [phone] OFFICE: [phone]

## 2011-07-22 NOTE — Consult Note (Signed)
I have seen and examined the patient. I agree with the findings above. I discussed with the patient the risks and benefits of surgery, including the possibility of infection, nerve injury, vessel injury, wound breakdown, arthritis, symptomatic hardware, DVT/ PE, loss of motion, and need for further surgery among others.  She wishes to proceed.   Regina Rosales H 07/22/2011 3:56 PM
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
## 2011-07-22 NOTE — Progress Notes (Signed)
This patient has been seen and I agree with the findings and treatment plan.  Very pleasant lady.  Hopefully she will be able to come back to the floor, if not will consider stepdown or ICU.  Currently she is hemodynamically stable and pain is well controlled.  [name]. Gae Bon, MD, FACS [phone] (pager) [phone] (direct pager) Trauma Surgeon

## 2011-07-22 NOTE — Preoperative (Signed)
Beta Blockers   Reason not to administer Beta Blockers:Not Applicable 

## 2011-07-22 NOTE — Anesthesia Postprocedure Evaluation (Signed)
  Anesthesia Post-op Note  Patient: Regina Rosales  Procedure(s) Performed:  EXTERNAL FIXATION LEG - external fixation adjustment right leg; DEBRIDEMENT AND CLOSURE WOUND  Patient Location: PACU  Anesthesia Type: General  Level of Consciousness: awake, alert , oriented and patient cooperative  Airway and Oxygen Therapy: Patient Spontanous Breathing and Patient connected to nasal cannula oxygen  Post-op Pain: mild  Post-op Assessment: Post-op Vital signs reviewed, Patient's Cardiovascular Status Stable, Respiratory Function Stable, Patent Airway, No signs of Nausea or vomiting and Pain level controlled  Post-op Vital Signs: stable  Complications: No apparent anesthesia complications

## 2011-07-22 NOTE — Brief Op Note (Signed)
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  PRE-OPERATIVE DIAGNOSIS:  Left calf wound 2.5 x 2.5cm, ex-fix of right distal femur and right tibial plateau fractures, right ankle bimalleolar fracture, multiple left foot fractures, right patella fracture  POST-OPERATIVE DIAGNOSIS:  Left calf wound 2.5 x 2.5cm, ex-fix of right distal femur and right tibial plateau fractures, right ankle bimalleolar fracture, multiple left foot fractures, right patella fracture  PROCEDURE:  Procedure(s): Revision EXTERNAL FIXATION right leg Z-plasty flap closure left calf Closed reduction of right tibial plateau Closed reduction right distal femur Stress flouro right bimalleolar ankle fracture and syndesmosis  SURGEON:  Surgeon(s): [employer]  PHYSICIAN ASSISTANT: Montez Morita, [facility]   ANESTHESIA:   general  EBL:  Total I/O In: 2100 [I.V.:2100] Out: 1640 [Urine:1640]  BLOOD ADMINISTERED:none  DRAINS: none   LOCAL MEDICATIONS USED:  NONE  SPECIMEN:  No Specimen  DISPOSITION OF SPECIMEN:  N/A  COUNTS:  YES  TOURNIQUET:  * Missing tourniquet times found for documented tourniquets in log:  [id number] *  DICTATION: .Other Dictation: Dictation Number [id number]  PLAN OF CARE: Admit to inpatient   PATIENT DISPOSITION:  PACU - hemodynamically stable.

## 2011-07-23 DIAGNOSIS — M79609 Pain in unspecified limb: Secondary | ICD-10-CM

## 2011-07-23 LAB — CBC
HCT: 24.2 % — ABNORMAL LOW (ref 36.0–46.0)
Hemoglobin: 8.3 g/dL — ABNORMAL LOW (ref 12.0–15.0)
RBC: 2.66 MIL/uL — ABNORMAL LOW (ref 3.87–5.11)
WBC: 9.6 10*3/uL (ref 4.0–10.5)

## 2011-07-23 NOTE — Progress Notes (Signed)
The patient was seen, examined and PA note reviewed and data reviewed.  I agree with the plan of action. 
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
## 2011-07-23 NOTE — Progress Notes (Signed)
Clinical Social Worker completed the psychosocial assessment which can be found in the shadow chart.  Patient is agreeable to skilled nursing facility placement in [facility].  Patient does not want to go to [facility] due to previous family deaths in the facility. Patient with support through friends but limited family support.    Clinical Social Worker has completed SBIRT with patient at bedside.  Patient with no current drug/ETOH use.  Patient very appreciative of support.  CSW will remain available for support and discharge planning needs.  [address] [place], [place] [phone]

## 2011-07-24 DIAGNOSIS — R600 Localized edema: Secondary | ICD-10-CM | POA: Diagnosis not present

## 2011-07-24 LAB — CBC
HCT: 25 % — ABNORMAL LOW (ref 36.0–46.0)
MCHC: 34.8 g/dL (ref 30.0–36.0)
Platelets: 132 10*3/uL — ABNORMAL LOW (ref 150–400)
RDW: 13.7 % (ref 11.5–15.5)
WBC: 9.6 10*3/uL (ref 4.0–10.5)

## 2011-07-24 MED ORDER — MORPHINE SULFATE (PF) 1 MG/ML IV SOLN
INTRAVENOUS | Status: AC
  Administered 2011-07-24: 1.5 mg
  Filled 2011-07-24: qty 25

## 2011-07-24 NOTE — Progress Notes (Signed)
*  PRELIMINARY RESULTS* Right upper extremity venous duplex completed. No evidence of DVT or superficial thrombosis. Mild to moderate interstitial fluid noted through out.Milta Deiters, [place] D 07/24/2011, 4:15 PM
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
## 2011-07-24 NOTE — Progress Notes (Signed)
Physical Therapy Treatment Patient Details Name: Regina Rosales MRN: [id number] DOB: 1951/04/17 Today's Date: 07/24/2011  PT Assessment/Plan  PT - Assessment/Plan Comments on Treatment Session: Pt progressing well. She was able to assist more with movement of LEs as well as increased assist with UEs. Pt still requires assist x 3 for movement throghout transfers. Plan for surgery next week PT Plan: Discharge plan remains appropriate;Frequency remains appropriate PT Frequency: Min 4X/week Follow Up Recommendations: Inpatient Rehab;Skilled nursing facility Equipment Recommended: Defer to next venue PT Goals  Acute Rehab PT Goals PT Goal Formulation: With patient Time For Goal Achievement: 2 weeks PT Goal: Sit to Supine/Side - Progress: Progressing toward goal PT Goal: Stand to Sit - Progress: Progressing toward goal PT Transfer Goal: Bed to Chair/Chair to Bed - Progress: Progressing toward goal PT Goal: Propel Wheelchair - Progress: Other (comment) (unassessed today)  PT Treatment Precautions/Restrictions  Precautions Precautions: Fall Restrictions Weight Bearing Restrictions: Yes RLE Weight Bearing: Non weight bearing LLE Weight Bearing: Non weight bearing Other Position/Activity Restrictions: External fixator Mobility (including Balance) Bed Mobility Bed Mobility: Yes Supine to Sit: 1: +2 Total assist;Patient percentage (comment);With rails;HOB elevated (Comment degrees) (HOB elevated 30 degrees; Pt 50%) Supine to Sit Details (indicate cue type and reason): VC for sequencing. Assist with weight bilateral legs and trunk control. Pt able to assist with movement of LEs as well as assist with UEs for support. Drawsheet used for transition. Sitting - Scoot to Edge of Bed: 1: +2 Total assist;Patient percentage (comment) (30%) Sitting - Scoot to Edge of Bed Details (indicate cue type and reason): VC for sequencing. Assist of bilateral LEs and UE for support. Drawsheet used for scooting  backwards to begin transfer. Transfers Transfers: Yes Anterior-Posterior Transfer: 1: +2 Total assist;Patient percentage (comment) (20%) Anterior-Posterior Transfer Details (indicate cue type and reason): Pt with minimal UE strength to assist with A-P transfer from bed to chair. Pt required assist x 3, two for drawsheet assist and trunk control while another with bilateral LEs. VC for sequencing throughout. Pt able to assist more this session with control of trunk and UEs, although still requires three for support    Exercise    End of Session PT - End of Session Activity Tolerance: Patient tolerated treatment well;Treatment limited secondary to medical complications (Comment) (Bilateral NWB) Patient left: in chair;with call bell in reach (legs on bed) Nurse Communication: Mobility status for transfers;Mobility status for ambulation;Need for lift equipment General Behavior During Session: [facility] for tasks performed Cognition: [facility] for tasks performed  Milana Kidney 07/24/2011, 10:25 AM  07/24/2011 Milana Kidney DPT PAGER: [phone] OFFICE: [phone]

## 2011-07-24 NOTE — Progress Notes (Signed)
Occupational Therapy Treatment Patient Details Name: Regina Rosales MRN: [id number] DOB: [birth year] Today's Date: 07/24/2011  OT Assessment/Plan OT Assessment/Plan OT Plan: Discharge plan remains appropriate OT Frequency: Min 2X/week Follow Up Recommendations: Inpatient Rehab;Skilled nursing facility Equipment Recommended: Defer to next venue OT Goals Acute Rehab OT Goals Time For Goal Achievement: 7 days ADL Goals ADL Goal: Grooming - Progress: Not addressed ADL Goal: Upper Body Bathing - Progress: Not addressed ADL Goal: Upper Body Dressing - Progress: Not addressed Additional ADL Goal #1: progressing toward goals  OT Treatment Precautions/Restrictions  Precautions Precautions: Fall Restrictions Weight Bearing Restrictions: Yes RLE Weight Bearing: Non weight bearing LLE Weight Bearing: Non weight bearing   ADL ADL ADL Comments: transfer with p.t. from bed to chair with ant./post. transfer, also provided level II theraband for ue exercise program Mobility  Bed Mobility Bed Mobility: Yes Supine to Sit: 1: +2 Total assist;HOB elevated (Comment degrees) (pt. 50%) Supine to Sit Details (indicate cue type and reason): inst. cues for sequencing, and assistance with weight for bilateral legs and trunk control, pt. can assist with movement of LES and assistance for ue support, drawsheet used for transition Sitting - Scoot to Edge of Bed: 1: +2 Total assist;Other (comment) (pt. 30%) Sitting - Scoot to Edge of Bed Details (indicate cue type and reason): VC for sequencing. Assist of bilateral LEs and UE for support. Drawsheet used for scooting backwards to begin transfer. Transfers Transfers: Yes Exercises Other Exercises Other Exercises: provided stage II (orange) theraband for ue exercises for pt. to complete throughout the day, pt. able to return demo and agreed to complete at least 2x a day  End of Session OT - End of Session Activity Tolerance: Patient limited by pain Patient  left: in chair;with call bell in reach General Behavior During Session: [facility] for tasks performed Cognition: [facility] for tasks performed  Robet Leu COTA/L 07/24/2011, 12:40 PM

## 2011-07-24 NOTE — Progress Notes (Signed)
Will keep pca per her request, [place] negative for dvt, will switch iv to left arm. Harden Mo
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
## 2011-07-25 MED ORDER — POLYETHYLENE GLYCOL 3350 17 G PO PACK
17.0000 g | PACK | Freq: Every day | ORAL | Status: DC
  Administered 2011-07-25 – 2011-08-13 (×11): 17 g via ORAL
  Filled 2011-07-25 (×20): qty 1

## 2011-07-25 MED ORDER — DOCUSATE SODIUM 100 MG PO CAPS
100.0000 mg | ORAL_CAPSULE | Freq: Two times a day (BID) | ORAL | Status: DC
  Administered 2011-07-25 – 2011-08-13 (×38): 100 mg via ORAL
  Filled 2011-07-25 (×41): qty 1

## 2011-07-25 MED ORDER — METHOCARBAMOL 500 MG PO TABS
500.0000 mg | ORAL_TABLET | Freq: Four times a day (QID) | ORAL | Status: DC | PRN
  Administered 2011-07-25 – 2011-07-26 (×2): 1000 mg via ORAL
  Administered 2011-07-26: 500 mg via ORAL
  Administered 2011-07-26: 1000 mg via ORAL
  Administered 2011-07-26: 500 mg via ORAL
  Administered 2011-07-27: 1000 mg via ORAL
  Administered 2011-07-28 – 2011-07-29 (×6): 500 mg via ORAL
  Administered 2011-07-30 – 2011-07-31 (×2): 1000 mg via ORAL
  Administered 2011-07-31 (×2): 500 mg via ORAL
  Administered 2011-08-01: 1000 mg via ORAL
  Administered 2011-08-01: 500 mg via ORAL
  Administered 2011-08-01: 1000 mg via ORAL
  Administered 2011-08-02 – 2011-08-03 (×3): 500 mg via ORAL
  Administered 2011-08-03 – 2011-08-06 (×6): 1000 mg via ORAL
  Filled 2011-07-25: qty 2
  Filled 2011-07-25: qty 1
  Filled 2011-07-25: qty 2
  Filled 2011-07-25: qty 1
  Filled 2011-07-25 (×2): qty 2
  Filled 2011-07-25: qty 1
  Filled 2011-07-25 (×3): qty 2
  Filled 2011-07-25 (×3): qty 1
  Filled 2011-07-25: qty 2
  Filled 2011-07-25: qty 1
  Filled 2011-07-25: qty 2
  Filled 2011-07-25 (×2): qty 1
  Filled 2011-07-25: qty 2
  Filled 2011-07-25: qty 1
  Filled 2011-07-25: qty 2
  Filled 2011-07-25 (×2): qty 1
  Filled 2011-07-25 (×2): qty 2
  Filled 2011-07-25: qty 1

## 2011-07-25 NOTE — Progress Notes (Signed)
The patient was seen, examined and PA note reviewed and data reviewed.  I agree with the plan of action. 
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
## 2011-07-26 MED ORDER — ENOXAPARIN SODIUM 30 MG/0.3ML ~~LOC~~ SOLN
30.0000 mg | Freq: Two times a day (BID) | SUBCUTANEOUS | Status: DC
  Administered 2011-07-26 – 2011-07-29 (×7): 30 mg via SUBCUTANEOUS
  Filled 2011-07-26 (×9): qty 0.3

## 2011-07-26 MED ORDER — METOCLOPRAMIDE HCL 5 MG/ML IJ SOLN
5.0000 mg | Freq: Three times a day (TID) | INTRAMUSCULAR | Status: DC | PRN
  Filled 2011-07-26: qty 2

## 2011-07-26 MED ORDER — ONDANSETRON HCL 4 MG/2ML IJ SOLN: 4.0000 mg | Freq: Four times a day (QID) | INTRAMUSCULAR | Status: DC | PRN

## 2011-07-26 MED ORDER — METOCLOPRAMIDE HCL 10 MG PO TABS: 5.0000 mg | ORAL_TABLET | Freq: Three times a day (TID) | ORAL | Status: DC | PRN

## 2011-07-26 MED ORDER — ONDANSETRON HCL 4 MG PO TABS: 4.0000 mg | ORAL_TABLET | Freq: Four times a day (QID) | ORAL | Status: DC | PRN
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
## 2011-07-26 NOTE — Progress Notes (Signed)
Clinical Social Worker spoke with patient at bedside to discuss placement options.  Per note in chart, patient not having surgery until next week.  Patient preference is for [facility] at discharge.  CSW contacted facility who states pending bed availability at time of discharge, patient is medically appropriate.  Clinical Social Worker will follow up with patient and facility once patient medically ready for discharge.  [address] [place], [place] [phone]
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
## 2011-07-27 ENCOUNTER — Encounter (HOSPITAL_COMMUNITY): Payer: Self-pay | Admitting: Orthopedic Surgery

## 2011-07-27 LAB — BASIC METABOLIC PANEL
CO2: 26 mEq/L (ref 19–32)
Calcium: 7.9 mg/dL — ABNORMAL LOW (ref 8.4–10.5)
Chloride: 105 mEq/L (ref 96–112)
Glucose, Bld: 111 mg/dL — ABNORMAL HIGH (ref 70–99)
Potassium: 3.8 mEq/L (ref 3.5–5.1)
Sodium: 139 mEq/L (ref 135–145)

## 2011-07-27 LAB — CBC
Hemoglobin: 7.9 g/dL — ABNORMAL LOW (ref 12.0–15.0)
Platelets: 259 10*3/uL (ref 150–400)
RBC: 2.55 MIL/uL — ABNORMAL LOW (ref 3.87–5.11)
WBC: 9 10*3/uL (ref 4.0–10.5)

## 2011-07-27 MED ORDER — FE FUMARATE-B12-VIT C-FA-IFC PO CAPS
1.0000 | ORAL_CAPSULE | Freq: Three times a day (TID) | ORAL | Status: DC
  Administered 2011-07-27 – 2011-08-13 (×49): 1 via ORAL
  Filled 2011-07-27 (×56): qty 1
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
## 2011-07-27 NOTE — Progress Notes (Signed)
Patient examined and I agree with the assessment and plan  Regina Rosales E  

## 2011-07-28 NOTE — Progress Notes (Signed)
Physical Therapy Treatment Patient Details Name: Regina Rosales MRN: [id number] DOB: December 07, 1950 Today's Date: 07/28/2011  PT Assessment/Plan  PT - Assessment/Plan Comments on Treatment Session: Pt progressing well. Treatment limited secondary to pt fearful and tired from previous work with Charity fundraiser and Psychologist, sport and exercise. Pt requested not to transfer to chair, therefore therapy performed into sitting at EOB and bed mobility. Will attempt A-P transfer out and back in bed next session. PT Plan: Discharge plan remains appropriate;Frequency remains appropriate PT Frequency: Min 5X/week Follow Up Recommendations: Inpatient Rehab;Skilled nursing facility Equipment Recommended: Defer to next venue PT Goals  Acute Rehab PT Goals PT Goal Formulation: With patient Time For Goal Achievement: 2 weeks PT Goal: Supine/Side to Sit - Progress: Progressing toward goal PT Goal: Sit to Supine/Side - Progress: Progressing toward goal PT Goal: Stand to Sit - Progress: Discontinued (comment) PT Transfer Goal: Bed to Chair/Chair to Bed - Progress: Progressing toward goal PT Goal: Propel Wheelchair - Progress: Other (comment) (unassessed today)  PT Treatment Precautions/Restrictions  Precautions Precautions: Fall Restrictions Weight Bearing Restrictions: Yes RLE Weight Bearing: Non weight bearing LLE Weight Bearing: Non weight bearing Other Position/Activity Restrictions: External fixator (limits hip flexion to 45 degrees) Mobility (including Balance) Bed Mobility Bed Mobility: Yes Supine to Sit: 1: +2 Total assist;HOB elevated (Comment degrees);Patient percentage (comment) (Pt 25%; HOB 35 degrees) Supine to Sit Details (indicate cue type and reason): VC throughout for sequencing. Pt required mod assist of RLE and minimal assist of LLE as well as minimal trunk control. Pt able to bear weight through UEs to sit in upright posture Sitting - Scoot to Edge of Bed: 3: Mod assist;With rail Sitting - Scoot to [place] of Bed  Details (indicate cue type and reason): Pt able to use rails and vcs for hand placement to scoot to head of bed. Assist of RLE only. Scooting to [facility]: 2: Max assist;With trapeze;With rail (bed in trendelenberg position) Scooting to [facility] Details (indicate cue type and reason): VC for sequencing. Pt used bilateral UE as well as bed in trendelenberg to assist with scooting to head of be  Balance Balance Assessed: Yes Static Sitting Balance Static Sitting - Balance Support: Bilateral upper extremity supported;Feet supported (feet supported by therapist dangling on side of bed) Static Sitting - Level of Assistance: 5: Stand by assistance Static Sitting - Comment/# of Minutes: 5 Exercise  Other Exercises Other Exercises: provided education of tricep exercise and pt return demonstration of bicep End of Session PT - End of Session Activity Tolerance: Patient limited by pain;Treatment limited secondary to medical complications (Comment) (muscle tremors) Patient left: in bed;with call bell in reach;with family/visitor present General Behavior During Session: [facility] for tasks performed Cognition: [facility] for tasks performed  Milana Kidney 07/28/2011, 3:33 PM  07/28/2011 Milana Kidney DPT PAGER: [phone] OFFICE: [phone]

## 2011-07-28 NOTE — Progress Notes (Signed)
This patient has been seen and I agree with the findings and treatment plan.  Georganne Siple O. Nyxon Strupp, III, MD, FACS [phone] (pager) [phone] (direct pager) Trauma Surgeon  
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
## 2011-07-28 NOTE — Progress Notes (Signed)
Occupational Therapy Treatment  Patient Details Name: Regina Rosales MRN: [id number] DOB: 08/28/1950 Today's Date: 07/28/2011  OT Assessment/Plan OT Assessment/Plan Comments on Treatment Session: pt declined OOB to chair anterior/posterior transfer OT Plan: Discharge plan remains appropriate OT Frequency: Min 2X/week Follow Up Recommendations: Inpatient Rehab;Skilled nursing facility Equipment Recommended: Defer to next venue OT Goals Acute Rehab OT Goals OT Goal Formulation: With patient Time For Goal Achievement: 7 days ADL Goals Pt Will Perform Grooming: with set-up;Sitting, edge of bed ADL Goal: Grooming - Progress: Progressing toward goals Pt Will Perform Upper Body Bathing: with set-up;with supervision;Sitting, edge of bed ADL Goal: Upper Body Bathing - Progress: Progressing toward goals Pt Will Perform Upper Body Dressing: with set-up;with supervision;Sitting, bed ADL Goal: Upper Body Dressing - Progress: Progressing toward goals Additional ADL Goal #1: Pt will perform UB exercises x 10-15 minutes to increase strength and activity tolerance for ADLs and functional tasks Miscellaneous OT Goals OT Goal: Miscellaneous Goal #1 - Progress: Progressing toward goals  OT Treatment Precautions/Restrictions  Precautions Precautions: Fall Restrictions Weight Bearing Restrictions: Yes RLE Weight Bearing: Non weight bearing LLE Weight Bearing: Non weight bearing Other Position/Activity Restrictions: External fixator (limits hip flexion to 45 degrees)   ADL ADL Eating/Feeding: Performed;Set up;Other (comment) (RN Bev providing medication) Eating/Feeding Details (indicate cue type and reason): pt with min v/c on taking pills Where Assessed - Eating/Feeding: Bed level Grooming: Not assessed (But could do with SETUP) ADL Comments: pt s/p LB bathing with RN Bev and tech Nettie Elm on arrival. Pt total A for Peri area Mobility  Bed Mobility Bed Mobility: Yes Supine to Sit: 1: +2 Total  assist;HOB elevated (Comment degrees);Patient percentage (comment) (Pt 25%; HOB 35 degrees) Supine to Sit Details (indicate cue type and reason): VC throughout for sequencing. Pt required mod assist of RLE and minimal assist of LLE as well as minimal trunk control. Pt able to bear weight through UEs to sit in upright posture. Pt with bil LE supported at EOB. Pt unable to tolerate BIl LE tangling. Pt using BIL UE to maintain EOB sititng with fair balance Sitting - Scoot to Edge of Bed: 3: Mod assist;With rail Transfers Transfers: No Exercises Other Exercises Other Exercises: provided education of tricep exercise and pt return demonstration of bicep  End of Session OT - End of Session Activity Tolerance: Patient limited by pain (pt with BIL LE muscle spasms) Patient left: in bed;with call bell in reach;with family/visitor present Astronomer) General Behavior During Session: [facility] for tasks performed Cognition: [facility] for tasks performed  Lucile Shutters  07/28/2011, 3:29 PM Pager: [phone]

## 2011-07-28 NOTE — Plan of Care (Signed)
Problem: Phase III Progression Outcomes Goal: Activity at appropriate level-compared to baseline (UP IN CHAIR FOR HEMODIALYSIS)  Outcome: Progressing Pt with BIL LE muscle spasms limiting participation in this session. Pt states " I am just scared but I want to do something" Pt agreeable to EOB sitting.

## 2011-07-29 LAB — CBC
Hemoglobin: 8 g/dL — ABNORMAL LOW (ref 12.0–15.0)
MCH: 31.5 pg (ref 26.0–34.0)
MCV: 97.6 fL (ref 78.0–100.0)
Platelets: 330 10*3/uL (ref 150–400)
RBC: 2.54 MIL/uL — ABNORMAL LOW (ref 3.87–5.11)
WBC: 10.4 10*3/uL (ref 4.0–10.5)

## 2011-07-29 LAB — BASIC METABOLIC PANEL
CO2: 26 mEq/L (ref 19–32)
Calcium: 8.1 mg/dL — ABNORMAL LOW (ref 8.4–10.5)
Chloride: 102 mEq/L (ref 96–112)
Creatinine, Ser: 0.62 mg/dL (ref 0.50–1.10)
Glucose, Bld: 104 mg/dL — ABNORMAL HIGH (ref 70–99)

## 2011-07-29 MED ORDER — DIPHENHYDRAMINE HCL 25 MG PO CAPS
25.0000 mg | ORAL_CAPSULE | Freq: Once | ORAL | Status: AC
  Administered 2011-07-29: 25 mg via ORAL
  Filled 2011-07-29: qty 1

## 2011-07-29 MED ORDER — DIAZEPAM 5 MG/ML IJ SOLN
2.5000 mg | Freq: Three times a day (TID) | INTRAMUSCULAR | Status: DC | PRN
  Administered 2011-07-31 – 2011-08-02 (×4): 2.5 mg via INTRAVENOUS
  Filled 2011-07-29 (×4): qty 2

## 2011-07-29 MED ORDER — ACETAMINOPHEN 325 MG PO TABS
650.0000 mg | ORAL_TABLET | Freq: Once | ORAL | Status: AC
  Administered 2011-07-29: 650 mg via ORAL
  Filled 2011-07-29: qty 2

## 2011-07-29 MED ORDER — ENOXAPARIN SODIUM 30 MG/0.3ML ~~LOC~~ SOLN
30.0000 mg | Freq: Once | SUBCUTANEOUS | Status: AC
  Administered 2011-07-29: 30 mg via SUBCUTANEOUS
  Filled 2011-07-29: qty 0.3

## 2011-07-29 MED ORDER — CEFAZOLIN SODIUM-DEXTROSE 2-3 GM-% IV SOLR
2.0000 g | Freq: Once | INTRAVENOUS | Status: AC
  Administered 2011-07-30: 2 g via INTRAVENOUS
  Filled 2011-07-29: qty 50

## 2011-07-29 NOTE — Progress Notes (Signed)
Physical Therapy Treatment Patient Details Name: Regina Rosales MRN: [id number] DOB: 12/07/1950 Today's Date: 07/29/2011  PT Assessment/Plan  PT - Assessment/Plan Comments on Treatment Session: Pt able to complete supine to sit transfer with only assist of 1 today. Pt still limited by LE movement unassisted although she has increased her UE strength which has improved functional mobility. (Attempt WC transfer after surgery) PT Plan: Discharge plan remains appropriate;Frequency remains appropriate PT Frequency: Min 5X/week Follow Up Recommendations: Inpatient Rehab;Skilled nursing facility Equipment Recommended: Defer to next venue PT Goals  Acute Rehab PT Goals PT Goal Formulation: With patient Time For Goal Achievement: 2 weeks PT Goal: Supine/Side to Sit - Progress: Progressing toward goal PT Goal: Sit to Supine/Side - Progress: Progressing toward goal PT Goal: Stand to Sit - Progress: Discontinued (comment) PT Transfer Goal: Bed to Chair/Chair to Bed - Progress: Other (comment) (unassessed today) Pt will Propel Wheelchair: > 150 feet;with supervision PT Goal: Propel Wheelchair - Progress: Other (comment) (unassessed today)  PT Treatment Precautions/Restrictions  Precautions Precautions: Fall Restrictions Weight Bearing Restrictions: Yes RLE Weight Bearing: Non weight bearing LLE Weight Bearing: Non weight bearing Other Position/Activity Restrictions: External fixator Mobility (including Balance) Bed Mobility Bed Mobility: Yes Rolling Right: 4: Min assist Rolling Right Details (indicate cue type and reason): Assist of LEs while turning. Pt able to use UEs to turn Rolling Left: 4: Min assist Rolling Left Details (indicate cue type and reason): Assist of LEs while turning. Pt able to use UEs to turn Supine to Sit: 3: Mod assist Supine to Sit Details (indicate cue type and reason): Pt required assist of only 1 today with LEs, R>L followed by minimal trunk support. Pt able to use  triceps and trunk control to get into upright sitting posture. Sitting - Scoot to Edge of Bed: 3: Mod assist;With rail Sitting - Scoot to [place] of Bed Details (indicate cue type and reason): VC for hand placement and sequencing for a more upright posture to edge of bed Scooting to [facility]: 2: Max assist;With trapeze;With rail Scooting to [facility] Details (indicate cue type and reason): Assisted pt with drawsheet to head of bed. Pt able to use rails to pull herself up with UEs only. VCs for sequencing Transfers Transfers: No (RN requested not to transfer secondary to blood transfusing)  Balance Balance Assessed: Yes Static Sitting Balance Static Sitting - Balance Support: Bilateral upper extremity supported;Feet unsupported Static Sitting - Level of Assistance: 5: Stand by assistance Static Sitting - Comment/# of Minutes: 10 minutes during functional activity(hair washing) Exercise    End of Session PT - End of Session Activity Tolerance: Patient tolerated treatment well Patient left: in bed;with call bell in reach Nurse Communication: Mobility status for transfers;Mobility status for ambulation;Need for lift equipment General Behavior During Session: [facility] for tasks performed Cognition: [facility] for tasks performed  Milana Kidney 07/29/2011, 3:25 PM  07/29/2011 Milana Kidney DPT PAGER: [phone] OFFICE: [phone]
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
## 2011-07-29 NOTE — Progress Notes (Signed)
For surgery tomorrow.  Still having significant pain.  This patient has been seen and I agree with the findings and treatment plan.  [name]. Gae Bon, MD, FACS [phone] (pager) [phone] (direct pager) Trauma Surgeon
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
## 2011-07-30 ENCOUNTER — Inpatient Hospital Stay (HOSPITAL_COMMUNITY): Payer: No Typology Code available for payment source

## 2011-07-30 ENCOUNTER — Encounter (HOSPITAL_COMMUNITY): Payer: Self-pay | Admitting: Anesthesiology

## 2011-07-30 ENCOUNTER — Encounter (HOSPITAL_COMMUNITY): Admission: EM | Disposition: A | Discharge status: Skilled Nursing Facility | Payer: Self-pay | Source: Ambulatory Visit

## 2011-07-30 ENCOUNTER — Inpatient Hospital Stay (HOSPITAL_COMMUNITY): Payer: No Typology Code available for payment source | Admitting: Anesthesiology

## 2011-07-30 HISTORY — PX: ORIF TIBIA PLATEAU: SHX2132

## 2011-07-30 HISTORY — PX: FEMUR IM NAIL: SHX1597

## 2011-07-30 LAB — CBC
MCH: 30.5 pg (ref 26.0–34.0)
MCHC: 32.1 g/dL (ref 30.0–36.0)
MCV: 95.1 fL (ref 78.0–100.0)
Platelets: 370 10*3/uL (ref 150–400)
RDW: 16.5 % — ABNORMAL HIGH (ref 11.5–15.5)
WBC: 13 10*3/uL — ABNORMAL HIGH (ref 4.0–10.5)

## 2011-07-30 LAB — BASIC METABOLIC PANEL
BUN: 12 mg/dL (ref 6–23)
Calcium: 8.3 mg/dL — ABNORMAL LOW (ref 8.4–10.5)
Creatinine, Ser: 0.66 mg/dL (ref 0.50–1.10)
GFR calc Af Amer: >90 mL/min (ref >90)
GFR calc non Af Amer: >90 mL/min (ref >90)

## 2011-07-30 SURGERY — INSERTION, INTRAMEDULLARY ROD, FEMUR, RETROGRADE
Anesthesia: General | Site: Leg Upper | Laterality: Right | Wound class: Clean

## 2011-07-30 MED ORDER — DEXTROSE 5 % IV SOLN
INTRAVENOUS | Status: DC | PRN
  Administered 2011-07-30: 13:00:00 via INTRAVENOUS

## 2011-07-30 MED ORDER — METHOCARBAMOL 100 MG/ML IJ SOLN
1000.0000 mg | INTRAMUSCULAR | Status: DC
  Filled 2011-07-30: qty 10

## 2011-07-30 MED ORDER — LIDOCAINE HCL (CARDIAC) 20 MG/ML IV SOLN
INTRAVENOUS | Status: DC | PRN
  Administered 2011-07-30: 60 mg via INTRAVENOUS

## 2011-07-30 MED ORDER — ACETAMINOPHEN 10 MG/ML IV SOLN
INTRAVENOUS | Status: DC | PRN
  Administered 2011-07-30: 1000 mg via INTRAVENOUS

## 2011-07-30 MED ORDER — CEFAZOLIN SODIUM 1-5 GM-% IV SOLN
1.0000 g | Freq: Four times a day (QID) | INTRAVENOUS | Status: AC
  Administered 2011-07-30 – 2011-07-31 (×3): 1 g via INTRAVENOUS
  Filled 2011-07-30 (×3): qty 50

## 2011-07-30 MED ORDER — DROPERIDOL 2.5 MG/ML IJ SOLN
INTRAMUSCULAR | Status: DC | PRN
  Administered 2011-07-30: 0.625 mg via INTRAVENOUS

## 2011-07-30 MED ORDER — PROMETHAZINE HCL 25 MG/ML IJ SOLN: 6.2500 mg | INTRAMUSCULAR | Status: DC | PRN

## 2011-07-30 MED ORDER — METOCLOPRAMIDE HCL 10 MG PO TABS: 5.0000 mg | ORAL_TABLET | Freq: Three times a day (TID) | ORAL | Status: DC | PRN

## 2011-07-30 MED ORDER — FENTANYL CITRATE 0.05 MG/ML IJ SOLN
INTRAMUSCULAR | Status: DC | PRN
  Administered 2011-07-30 (×3): 50 ug via INTRAVENOUS
  Administered 2011-07-30: 250 ug via INTRAVENOUS
  Administered 2011-07-30: 50 ug via INTRAVENOUS
  Administered 2011-07-30: 100 ug via INTRAVENOUS
  Administered 2011-07-30: 50 ug via INTRAVENOUS
  Administered 2011-07-30 (×2): 100 ug via INTRAVENOUS

## 2011-07-30 MED ORDER — PROPOFOL 10 MG/ML IV EMUL
INTRAVENOUS | Status: DC | PRN
  Administered 2011-07-30: 20 mg via INTRAVENOUS
  Administered 2011-07-30: 110 mg via INTRAVENOUS
  Administered 2011-07-30: 20 mg via INTRAVENOUS

## 2011-07-30 MED ORDER — LACTATED RINGERS IV SOLN
INTRAVENOUS | Status: DC | PRN
  Administered 2011-07-30 (×3): via INTRAVENOUS

## 2011-07-30 MED ORDER — NALOXONE HCL 0.4 MG/ML IJ SOLN: 0.4000 mg | INTRAMUSCULAR | Status: DC | PRN

## 2011-07-30 MED ORDER — METOCLOPRAMIDE HCL 5 MG/ML IJ SOLN
5.0000 mg | Freq: Three times a day (TID) | INTRAMUSCULAR | Status: DC | PRN
  Filled 2011-07-30: qty 2

## 2011-07-30 MED ORDER — METHOCARBAMOL 100 MG/ML IJ SOLN
1000.0000 mg | INTRAVENOUS | Status: DC
  Filled 2011-07-30: qty 10

## 2011-07-30 MED ORDER — VECURONIUM BROMIDE 10 MG IV SOLR
INTRAVENOUS | Status: DC | PRN
  Administered 2011-07-30 (×2): 2 mg via INTRAVENOUS
  Administered 2011-07-30: 3 mg via INTRAVENOUS

## 2011-07-30 MED ORDER — HYDROMORPHONE HCL PF 1 MG/ML IJ SOLN
0.2500 mg | INTRAMUSCULAR | Status: DC | PRN
  Administered 2011-07-30 (×2): 0.5 mg via INTRAVENOUS

## 2011-07-30 MED ORDER — ONDANSETRON HCL 4 MG/2ML IJ SOLN
INTRAMUSCULAR | Status: DC | PRN
  Administered 2011-07-30 (×2): 4 mg via INTRAVENOUS

## 2011-07-30 MED ORDER — METHOCARBAMOL 100 MG/ML IJ SOLN: 1000.0000 mg | Freq: Once | INTRAMUSCULAR | Status: DC

## 2011-07-30 MED ORDER — MIDAZOLAM HCL 5 MG/5ML IJ SOLN
INTRAMUSCULAR | Status: DC | PRN
  Administered 2011-07-30 (×2): 1 mg via INTRAVENOUS

## 2011-07-30 MED ORDER — LACTATED RINGERS IV SOLN
INTRAVENOUS | Status: DC
  Administered 2011-08-01 (×2): via INTRAVENOUS

## 2011-07-30 MED ORDER — 0.9 % SODIUM CHLORIDE (POUR BTL) OPTIME
TOPICAL | Status: DC | PRN
  Administered 2011-07-30: 1000 mL

## 2011-07-30 MED ORDER — NEOSTIGMINE METHYLSULFATE 1 MG/ML IJ SOLN
INTRAMUSCULAR | Status: DC | PRN
  Administered 2011-07-30: 4 mg via INTRAVENOUS

## 2011-07-30 MED ORDER — ONDANSETRON HCL 4 MG PO TABS: 4.0000 mg | ORAL_TABLET | Freq: Four times a day (QID) | ORAL | Status: DC | PRN

## 2011-07-30 MED ORDER — MORPHINE SULFATE (PF) 1 MG/ML IV SOLN
INTRAVENOUS | Status: DC
  Administered 2011-07-30: 18:00:00 via INTRAVENOUS
  Administered 2011-07-31: 0.05 mg via INTRAVENOUS
  Administered 2011-07-31: 2 mg via INTRAVENOUS

## 2011-07-30 MED ORDER — ACETAMINOPHEN 10 MG/ML IV SOLN
INTRAVENOUS | Status: AC
  Filled 2011-07-30: qty 100

## 2011-07-30 MED ORDER — HETASTARCH-ELECTROLYTES 6 % IV SOLN
INTRAVENOUS | Status: DC | PRN
  Administered 2011-07-30 (×2): via INTRAVENOUS

## 2011-07-30 MED ORDER — FENTANYL CITRATE 0.05 MG/ML IJ SOLN
50.0000 ug | INTRAMUSCULAR | Status: DC | PRN
  Administered 2011-07-30: 100 ug via INTRAVENOUS

## 2011-07-30 MED ORDER — LACTATED RINGERS IV SOLN
INTRAVENOUS | Status: DC
  Administered 2011-07-30 (×2): via INTRAVENOUS

## 2011-07-30 MED ORDER — ENOXAPARIN SODIUM 30 MG/0.3ML ~~LOC~~ SOLN
30.0000 mg | Freq: Two times a day (BID) | SUBCUTANEOUS | Status: DC
  Administered 2011-07-30 – 2011-08-04 (×11): 30 mg via SUBCUTANEOUS
  Filled 2011-07-30 (×15): qty 0.3

## 2011-07-30 MED ORDER — LACTATED RINGERS IV SOLN: INTRAVENOUS | Status: DC | PRN

## 2011-07-30 MED ORDER — DEXAMETHASONE SODIUM PHOSPHATE 4 MG/ML IJ SOLN
INTRAMUSCULAR | Status: DC | PRN
  Administered 2011-07-30: 6 mg via INTRAVENOUS

## 2011-07-30 MED ORDER — MIDAZOLAM HCL 2 MG/2ML IJ SOLN
1.0000 mg | INTRAMUSCULAR | Status: DC | PRN
  Administered 2011-07-30: 2 mg via INTRAVENOUS

## 2011-07-30 MED ORDER — GLYCOPYRROLATE 0.2 MG/ML IJ SOLN
INTRAMUSCULAR | Status: DC | PRN
  Administered 2011-07-30: .7 mg via INTRAVENOUS

## 2011-07-30 MED ORDER — ONDANSETRON HCL 4 MG/2ML IJ SOLN: 4.0000 mg | Freq: Four times a day (QID) | INTRAMUSCULAR | Status: DC | PRN

## 2011-07-30 MED ORDER — FENTANYL CITRATE 0.05 MG/ML IJ SOLN
INTRAMUSCULAR | Status: AC
  Filled 2011-07-30: qty 2

## 2011-07-30 MED ORDER — DIPHENHYDRAMINE HCL 50 MG/ML IJ SOLN: 12.5000 mg | Freq: Four times a day (QID) | INTRAMUSCULAR | Status: DC | PRN

## 2011-07-30 MED ORDER — MIDAZOLAM HCL 2 MG/2ML IJ SOLN
INTRAMUSCULAR | Status: AC
  Filled 2011-07-30: qty 2

## 2011-07-30 MED ORDER — DIPHENHYDRAMINE HCL 12.5 MG/5ML PO ELIX
12.5000 mg | ORAL_SOLUTION | Freq: Four times a day (QID) | ORAL | Status: DC | PRN
  Filled 2011-07-30: qty 5

## 2011-07-30 MED ORDER — SODIUM CHLORIDE 0.9 % IJ SOLN: 9.0000 mL | INTRAMUSCULAR | Status: DC | PRN

## 2011-07-30 MED ORDER — MEPERIDINE HCL 25 MG/ML IJ SOLN: 6.2500 mg | INTRAMUSCULAR | Status: DC | PRN

## 2011-07-30 MED ORDER — ROCURONIUM BROMIDE 100 MG/10ML IV SOLN
INTRAVENOUS | Status: DC | PRN
  Administered 2011-07-30: 50 mg via INTRAVENOUS

## 2011-07-30 MED ORDER — OXYCODONE HCL 5 MG PO TABS
5.0000 mg | ORAL_TABLET | ORAL | Status: DC | PRN
  Administered 2011-07-31 – 2011-08-03 (×9): 10 mg via ORAL
  Filled 2011-07-30 (×9): qty 2

## 2011-07-30 SURGICAL SUPPLY — 83 items
4.5MM CROWE POINT TWIST DRILL ×4 IMPLANT
9x340mm retrograde femoral nai ×4 IMPLANT
BANDAGE ELASTIC 4 VELCRO ST LF (GAUZE/BANDAGES/DRESSINGS) ×4 IMPLANT
BANDAGE ELASTIC 6 VELCRO ST LF (GAUZE/BANDAGES/DRESSINGS) ×8 IMPLANT
BANDAGE ESMARK 6X9 LF (GAUZE/BANDAGES/DRESSINGS) IMPLANT
BANDAGE GAUZE ELAST BULKY 4 IN (GAUZE/BANDAGES/DRESSINGS) ×8 IMPLANT
BIT DRILL 100X2.5XANTM LCK (BIT) ×3 IMPLANT
BIT DRILL CAL (BIT) ×3 IMPLANT
BIT DRILL CALIBRATED 4.3MMX365 (DRILL) ×3 IMPLANT
BIT DRILL CROWE PNT TWST 4.5MM (DRILL) ×3 IMPLANT
BIT DRL 100X2.5XANTM LCK (BIT) ×3
BLADE SURG 10 STRL SS (BLADE) ×4 IMPLANT
BNDG COHESIVE 4X5 TAN STRL (GAUZE/BANDAGES/DRESSINGS) IMPLANT
BNDG ESMARK 6X9 LF (GAUZE/BANDAGES/DRESSINGS)
BRUSH SCRUB DISP (MISCELLANEOUS) ×8 IMPLANT
CALIBRATED DRIL 4.3MMX365MM ×4 IMPLANT
CLOTH BEACON ORANGE TIMEOUT ST (SAFETY) ×4 IMPLANT
COVER MAYO STAND STRL (DRAPES) IMPLANT
COVER SURGICAL LIGHT HANDLE (MISCELLANEOUS) ×8 IMPLANT
DRAPE C-ARM 42X72 X-RAY (DRAPES) ×4 IMPLANT
DRAPE C-ARMOR (DRAPES) ×4 IMPLANT
DRAPE INCISE IOBAN 66X45 STRL (DRAPES) IMPLANT
DRAPE ORTHO SPLIT 77X108 STRL (DRAPES) ×2
DRAPE SURG ORHT 6 SPLT 77X108 (DRAPES) ×6 IMPLANT
DRAPE U-SHAPE 47X51 STRL (DRAPES) ×4 IMPLANT
DRILL BIT 2.5MM (BIT) ×1
DRILL BIT CAL (BIT) ×4
DRILL CALIBRATED 4.3MMX365 (DRILL) ×4
DRILL CROWE POINT TWIST 4.5MM (DRILL) ×4
DRSG ADAPTIC 3X8 NADH LF (GAUZE/BANDAGES/DRESSINGS) ×4 IMPLANT
ELECT CAUTERY BLADE 6.4 (BLADE) ×4 IMPLANT
ELECT REM PT RETURN 9FT ADLT (ELECTROSURGICAL) ×4
ELECTRODE REM PT RTRN 9FT ADLT (ELECTROSURGICAL) ×3 IMPLANT
EVACUATOR 1/8 PVC DRAIN (DRAIN) IMPLANT
GAUZE XEROFORM 1X8 LF (GAUZE/BANDAGES/DRESSINGS) IMPLANT
GLOVE BIO SURGEON STRL SZ7.5 (GLOVE) ×8 IMPLANT
GLOVE BIO SURGEON STRL SZ8 (GLOVE) ×8 IMPLANT
GLOVE BIOGEL PI IND STRL 7.5 (GLOVE) ×3 IMPLANT
GLOVE BIOGEL PI IND STRL 8 (GLOVE) ×3 IMPLANT
GLOVE BIOGEL PI INDICATOR 7.5 (GLOVE) ×1
GLOVE BIOGEL PI INDICATOR 8 (GLOVE) ×1
GOWN PREVENTION PLUS XLARGE (GOWN DISPOSABLE) ×4 IMPLANT
GOWN STRL NON-REIN LRG LVL3 (GOWN DISPOSABLE) ×8 IMPLANT
GUIDEWIRE BEAD TIP (WIRE) ×4 IMPLANT
GUIDEWIRE LAGSCREW 3.2X460 (WIRE) ×4 IMPLANT
K-WIRE ACE 1.6X6 (WIRE) ×12
KIT BASIN OR (CUSTOM PROCEDURE TRAY) ×4 IMPLANT
KIT ROOM TURNOVER OR (KITS) ×4 IMPLANT
KWIRE ACE 1.6X6 (WIRE) ×9 IMPLANT
LAG SCREW GUIDE WIRE 3.2X460MM ×4 IMPLANT
PACK GENERAL/GYN (CUSTOM PROCEDURE TRAY) ×4 IMPLANT
PACK ORTHO EXTREMITY (CUSTOM PROCEDURE TRAY) IMPLANT
PAD ARMBOARD 7.5X6 YLW CONV (MISCELLANEOUS) ×8 IMPLANT
PIN THREADED GUIDE ACE (PIN) ×8 IMPLANT
PLATE LOCK 5H STD RT PROX TIB (Plate) ×4 IMPLANT
SCREW CORT TI DBL LEAD 5X30 (Screw) ×8 IMPLANT
SCREW CORT TI DBL LEAD 5X65 (Screw) ×4 IMPLANT
SCREW CORT TI DBL LEAD 5X70 (Screw) ×4 IMPLANT
SCREW CORT TI DBL LEAD 5X75 (Screw) ×4 IMPLANT
SCREW CORT TI DBL LEAD 5X80 (Screw) ×4 IMPLANT
SCREW CORT TI DBL LEAD 5X85 (Screw) ×4 IMPLANT
SCREW CORTICAL 3.5MM  28MM (Screw) ×1 IMPLANT
SCREW CORTICAL 3.5MM 28MM (Screw) ×3 IMPLANT
SCREW LOCK CORT STAR 3.5X18 (Screw) ×8 IMPLANT
SCREW LOCK CORT STAR 3.5X30 (Screw) ×8 IMPLANT
SCREW LOCK CORT STAR 3.5X65 (Screw) ×12 IMPLANT
SCREW LOCK CORT STAR 3.5X70 (Screw) ×4 IMPLANT
SCREW LOCK CORT STAR 3.5X75 (Screw) ×8 IMPLANT
SPONGE GAUZE 4X4 12PLY (GAUZE/BANDAGES/DRESSINGS) ×4 IMPLANT
SPONGE LAP 18X18 X RAY DECT (DISPOSABLE) ×4 IMPLANT
STAPLER VISISTAT 35W (STAPLE) ×4 IMPLANT
STOCKINETTE IMPERVIOUS LG (DRAPES) IMPLANT
SUT PROLENE 3 0 PS 2 (SUTURE) IMPLANT
SUT VIC AB 0 CT1 27 (SUTURE)
SUT VIC AB 0 CT1 27XBRD ANBCTR (SUTURE) IMPLANT
SUT VIC AB 2-0 CT1 27 (SUTURE)
SUT VIC AB 2-0 CT1 TAPERPNT 27 (SUTURE) IMPLANT
SUT VIC AB 2-0 CT3 27 (SUTURE) IMPLANT
TOWEL OR 17X24 6PK STRL BLUE (TOWEL DISPOSABLE) ×4 IMPLANT
TOWEL OR 17X26 10 PK STRL BLUE (TOWEL DISPOSABLE) ×8 IMPLANT
TRAY FOLEY CATH 14FR (SET/KITS/TRAYS/PACK) IMPLANT
TUBE CONNECTING 12X1/4 (SUCTIONS) ×4 IMPLANT
YANKAUER SUCT BULB TIP NO VENT (SUCTIONS) IMPLANT

## 2011-07-30 NOTE — Progress Notes (Signed)
Clinical Social Worker continuing to follow patient for emotional support and discharge planning needs.  Patient in OR having IMN and ORIF R tibial plateau.  CSW contacted [facility] due to patient preference for placement.  Facility stated that patient was medically appropriate for placement, however it would depend on bed availability at discharge.  CSW will follow up with patient and facility to facilitate patient discharge needs once medically ready.  [address] [place], [place] [phone]

## 2011-07-30 NOTE — Transfer of Care (Signed)
Immediate Anesthesia Transfer of Care Note  Patient: Regina Rosales  Procedure(s) Performed:  INTRAMEDULLARY (IM) RETROGRADE FEMORAL NAILING - IM Nail Right Femur; OPEN REDUCTION INTERNAL FIXATION (ORIF) TIBIAL PLATEAU  Patient Location: PACU  Anesthesia Type: General  Level of Consciousness: awake, alert  and oriented  Airway & Oxygen Therapy: Patient Spontanous Breathing and Patient connected to face mask oxygen  Post-op Assessment: Report given to PACU RN, Post -op Vital signs reviewed and stable and Patient moving all extremities  Post vital signs: Reviewed and stable  Complications: No apparent anesthesia complications

## 2011-07-30 NOTE — Preoperative (Signed)
Beta Blockers   Reason not to administer Beta Blockers:Not Applicable 

## 2011-07-30 NOTE — Anesthesia Postprocedure Evaluation (Signed)
  Anesthesia Post-op Note  Patient: Regina Rosales  Procedure(s) Performed:  INTRAMEDULLARY (IM) RETROGRADE FEMORAL NAILING - IM Nail Right Femur; OPEN REDUCTION INTERNAL FIXATION (ORIF) TIBIAL PLATEAU  Patient Location: PACU  Anesthesia Type: General  Level of Consciousness: awake and oriented  Airway and Oxygen Therapy: Patient Spontanous Breathing and Patient connected to nasal cannula oxygen  Post-op Pain: mild  Post-op Assessment: Post-op Vital signs reviewed, Patient's Cardiovascular Status Stable, Respiratory Function Stable, Patent Airway, No signs of Nausea or vomiting and Pain level controlled  Post-op Vital Signs: Reviewed and stable  Complications: No apparent anesthesia complications
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
## 2011-07-30 NOTE — Anesthesia Procedure Notes (Addendum)
Procedure Name: Intubation Date/Time: 07/30/2011 12:25 PM Performed by: Wray Kearns A Pre-anesthesia Checklist: Patient identified, Timeout performed, Emergency Drugs available, Suction available and Patient being monitored Patient Re-evaluated:Patient Re-evaluated prior to inductionOxygen Delivery Method: Circle System Utilized Preoxygenation: Pre-oxygenation with 100% oxygen Intubation Type: IV induction and Circoid Pressure applied Ventilation: Mask ventilation without difficulty and Oral airway inserted - appropriate to patient size Laryngoscope Size: Mac and 3 Grade View: Grade I Tube type: Oral Tube size: 7.5 mm Number of attempts: 1 Airway Equipment and Method: stylet Placement Confirmation: ETT inserted through vocal cords under direct vision,  breath sounds checked- equal and bilateral and positive ETCO2 Secured at: 22 cm Tube secured with: Tape Dental Injury: Teeth and Oropharynx as per pre-operative assessment

## 2011-07-30 NOTE — Anesthesia Preprocedure Evaluation (Addendum)
Anesthesia Evaluation  Patient identified by MRN, date of birth, ID band Patient awake    Reviewed: Allergy & Precautions, H&P , NPO status , Patient's Chart, lab work & pertinent test results  Airway Mallampati: II TM Distance: >3 FB Neck ROM: Full    Dental No notable dental hx. (+) Edentulous Upper and Edentulous Lower   Pulmonary neg pulmonary ROS,  clear to auscultation  Pulmonary exam normal       Cardiovascular neg cardio ROS Regular Normal    Neuro/Psych Negative Neurological ROS  Negative Psych ROS   GI/Hepatic negative GI ROS, Neg liver ROS,   Endo/Other  Negative Endocrine ROS  Renal/GU negative Renal ROS  Genitourinary negative   Musculoskeletal   Abdominal (+) obese,   Peds  Hematology negative hematology ROS (+)   Anesthesia Other Findings   Reproductive/Obstetrics negative OB ROS                           Anesthesia Physical Anesthesia Plan  ASA: II  Anesthesia Plan: General   Post-op Pain Management:    Induction: Intravenous  Airway Management Planned: Oral ETT  Additional Equipment:   Intra-op Plan:   Post-operative Plan: Extubation in OR  Informed Consent: I have reviewed the patients History and Physical, chart, labs and discussed the procedure including the risks, benefits and alternatives for the proposed anesthesia with the patient or authorized representative who has indicated his/her understanding and acceptance.     Plan Discussed with: CRNA  Anesthesia Plan Comments:         Anesthesia Quick Evaluation

## 2011-07-30 NOTE — Progress Notes (Signed)
The patient was in the OR earlier.  Doing well but will need placement.  This patient has been seen and I agree with the findings and treatment plan.  [name]. Gae Bon, MD, FACS [phone] (pager) [phone] (direct pager) Trauma Surgeon
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
## 2011-07-31 LAB — CBC
MCV: 98.2 fL (ref 78.0–100.0)
Platelets: 322 10*3/uL (ref 150–400)
RBC: 2.27 MIL/uL — ABNORMAL LOW (ref 3.87–5.11)
RDW: 16.5 % — ABNORMAL HIGH (ref 11.5–15.5)
WBC: 11.8 10*3/uL — ABNORMAL HIGH (ref 4.0–10.5)

## 2011-07-31 LAB — BASIC METABOLIC PANEL
BUN: 13 mg/dL (ref 6–23)
CO2: 27 mEq/L (ref 19–32)
GFR calc non Af Amer: >90 mL/min (ref >90)
Glucose, Bld: 100 mg/dL — ABNORMAL HIGH (ref 70–99)
Potassium: 4.2 mEq/L (ref 3.5–5.1)
Sodium: 136 mEq/L (ref 135–145)

## 2011-07-31 MED ORDER — ACETAMINOPHEN 325 MG PO TABS
650.0000 mg | ORAL_TABLET | Freq: Once | ORAL | Status: AC
  Administered 2011-07-31: 650 mg via ORAL
  Filled 2011-07-31: qty 2

## 2011-07-31 MED ORDER — DIPHENHYDRAMINE HCL 25 MG PO CAPS
25.0000 mg | ORAL_CAPSULE | Freq: Once | ORAL | Status: AC
  Administered 2011-07-31: 25 mg via ORAL
  Filled 2011-07-31: qty 1
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
## 2011-07-31 NOTE — Progress Notes (Signed)
Orthopedic Tech Progress Note Patient Details:  Regina Rosales [birth year] [id number]     Overhead frame  [name] 07/31/2011, 5:21 PM
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
## 2011-08-01 LAB — BASIC METABOLIC PANEL
BUN: 11 mg/dL (ref 6–23)
Chloride: 104 mEq/L (ref 96–112)
Creatinine, Ser: 0.68 mg/dL (ref 0.50–1.10)
GFR calc Af Amer: >90 mL/min (ref >90)
Glucose, Bld: 110 mg/dL — ABNORMAL HIGH (ref 70–99)
Potassium: 3.8 mEq/L (ref 3.5–5.1)

## 2011-08-01 LAB — CBC
HCT: 30.6 % — ABNORMAL LOW (ref 36.0–46.0)
Hemoglobin: 9.8 g/dL — ABNORMAL LOW (ref 12.0–15.0)
MCH: 30.2 pg (ref 26.0–34.0)
MCHC: 32 g/dL (ref 30.0–36.0)
MCV: 94.4 fL (ref 78.0–100.0)
RDW: 18.1 % — ABNORMAL HIGH (ref 11.5–15.5)

## 2011-08-01 MED ORDER — MORPHINE SULFATE 2 MG/ML IJ SOLN: 2.0000 mg | INTRAMUSCULAR | Status: DC | PRN

## 2011-08-01 MED ORDER — BISACODYL 10 MG RE SUPP: 10.0000 mg | Freq: Once | RECTAL | Status: DC

## 2011-08-01 NOTE — Progress Notes (Signed)
Seen, agree with above. Having issues with BMs.  Will write for suppository.
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
## 2011-08-02 ENCOUNTER — Inpatient Hospital Stay (HOSPITAL_COMMUNITY): Payer: No Typology Code available for payment source

## 2011-08-02 LAB — TYPE AND SCREEN
ABO/RH(D): A POS
Antibody Screen: NEGATIVE
Unit division: 0
Unit division: 0
Unit division: 0
Unit division: 0

## 2011-08-02 LAB — BASIC METABOLIC PANEL
BUN: 11 mg/dL (ref 6–23)
Calcium: 8 mg/dL — ABNORMAL LOW (ref 8.4–10.5)
Creatinine, Ser: 0.56 mg/dL (ref 0.50–1.10)
GFR calc Af Amer: >90 mL/min (ref >90)
GFR calc non Af Amer: >90 mL/min (ref >90)
Glucose, Bld: 91 mg/dL (ref 70–99)

## 2011-08-02 MED ORDER — PATIENT'S GUIDE TO USING COUMADIN BOOK
Freq: Once | Status: DC
  Filled 2011-08-02: qty 1

## 2011-08-02 MED ORDER — WARFARIN SODIUM 5 MG PO TABS
5.0000 mg | ORAL_TABLET | Freq: Once | ORAL | Status: AC
  Administered 2011-08-02: 5 mg via ORAL
  Filled 2011-08-02: qty 1

## 2011-08-02 MED ORDER — WARFARIN VIDEO
Freq: Once | Status: AC
  Administered 2011-08-02: 19:00:00
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
## 2011-08-02 NOTE — Progress Notes (Signed)
Occupational Therapy Treatment Patient Details Name: Regina Rosales MRN: [id number] DOB: March 17, 1951 Today's Date: 08/02/2011  OT Assessment/Plan OT Assessment/Plan Comments on Treatment Session: Pt performed anterior/posterior transfer from bed to chair but unable to return to bed from chair.  Pt to sit up in chair for approximately an hour and RN notified to use lift equipment to return pt back to bed. OT Plan: Discharge plan remains appropriate OT Frequency: Min 2X/week Follow Up Recommendations: Inpatient Rehab;Skilled nursing facility Equipment Recommended: Defer to next venue OT Goals Acute Rehab OT Goals OT Goal Formulation: With patient Time For Goal Achievement: 7 days ADL Goals Pt Will Perform Grooming: with set-up;Sitting, edge of bed ADL Goal: Grooming - Progress: Not addressed Pt Will Perform Upper Body Bathing: with set-up;with supervision;Sitting, edge of bed ADL Goal: Upper Body Bathing - Progress: Not addressed Pt Will Perform Upper Body Dressing: with set-up;with supervision;Sitting, bed ADL Goal: Upper Body Dressing - Progress: Met Additional ADL Goal #1: Pt will perform UB exercises x 10-15 minutes to increase strength and activity tolerance for ADLs and functional tasks Miscellaneous OT Goals OT Goal: Miscellaneous Goal #1 - Progress: Progressing toward goals  OT Treatment Precautions/Restrictions  Precautions Precautions: Fall Restrictions Weight Bearing Restrictions: Yes RLE Weight Bearing: Non weight bearing LLE Weight Bearing: Non weight bearing   ADL ADL Upper Body Dressing: Set up;Performed Where Assessed - Upper Body Dressing: Sitting, bed;Unsupported Toilet Transfer: Simulated;+2 Total assistance;Comment for patient % (60%) Toilet Transfer Details (indicate cue type and reason): Pt transferred from bed to recliner Toilet Transfer Method: Anterior-posterior Mobility  Bed Mobility Bed Mobility: Yes Supine to Sit: 4: Min assist;HOB elevated  (Comment degrees);With rails (40 degrees) Supine to Sit Details (indicate cue type and reason): VC for sequencing and hand placement Sitting - Scoot to Edge of Bed: 4: Min assist Sitting - Scoot to [place] of Bed Details (indicate cue type and reason): support to bilateral LE Transfers Transfers: No Exercises    End of Session OT - End of Session Activity Tolerance: Patient limited by fatigue Patient left: in chair;with call bell in reach Nurse Communication: Other (comment) (RN made aware to lift pt back to bed at 4:00) General Behavior During Session: [facility] for tasks performed Cognition: [facility] for tasks performed  Cipriano Mile  08/02/2011, 3:35 PM 08/02/2011 Cipriano Mile OTR/L Pager [phone] Office [phone]

## 2011-08-02 NOTE — Progress Notes (Signed)
Physical Therapy Treatment Patient Details Name: Regina Rosales MRN: [id number] DOB: May 19, 1951 Today's Date: 08/02/2011  PT Assessment/Plan  PT - Assessment/Plan Comments on Treatment Session: Pt able to sit on edge of bed with feet supported for around 5 minutes without any UE assist while dressing. Pt has made great improvement of her UE strength assisting with improved mobility with all transfers. PT Plan: Discharge plan remains appropriate;Frequency remains appropriate PT Frequency: Min 5X/week Follow Up Recommendations: Inpatient Rehab;Skilled nursing facility Equipment Recommended: Defer to next venue PT Goals  Acute Rehab PT Goals PT Goal Formulation: With patient Time For Goal Achievement: 2 weeks PT Goal: Supine/Side to Sit - Progress: Progressing toward goal PT Goal: Sit to Supine/Side - Progress: Met PT Goal: Stand to Sit - Progress: Discontinued (comment) PT Transfer Goal: Bed to Chair/Chair to Bed - Progress: Progressing toward goal PT Goal: Propel Wheelchair - Progress: Other (comment) (unassessed today)  PT Treatment Precautions/Restrictions  Precautions Precautions: Fall Restrictions Weight Bearing Restrictions: Yes RLE Weight Bearing: Non weight bearing LLE Weight Bearing: Non weight bearing Other Position/Activity Restrictions: External fixator Mobility (including Balance) Bed Mobility Bed Mobility: Yes Supine to Sit: 4: Min assist;HOB elevated (Comment degrees);With rails (40) Supine to Sit Details (indicate cue type and reason): VC for sequencing and hand placement throughout. Min assist with bilateral LEs for support Sitting - Scoot to Edge of Bed: 4: Min assist Sitting - Scoot to [place] of Bed Details (indicate cue type and reason): Bilateral LE assist. Minimal drawsheet assist. VC for hand placement Transfers Transfers: Yes Anterior-Posterior Transfer: 1: +2 Total assist;Patient percentage (comment) (60%) Anterior-Posterior Transfer Details (indicate cue  type and reason): Pt able to assist with UEs into transfer. Assist of Bilateral LEs for comfort. minimal assist with drawsheet by another. VC for sequencing and hand placement for technique  Balance Balance Assessed: Yes Static Sitting Balance Static Sitting - Balance Support: No upper extremity supported Static Sitting - Level of Assistance: 5: Stand by assistance Static Sitting - Comment/# of Minutes: 5 Exercise    End of Session PT - End of Session Activity Tolerance: Patient tolerated treatment well Patient left: with call bell in reach;in chair Nurse Communication: Mobility status for transfers;Mobility status for ambulation;Need for lift equipment General Behavior During Session: [facility] for tasks performed Cognition: [facility] for tasks performed  Milana Kidney 08/02/2011, 3:48 PM  08/02/2011 Milana Kidney DPT PAGER: [phone] OFFICE: [phone]
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
## 2011-08-03 DIAGNOSIS — H538 Other visual disturbances: Secondary | ICD-10-CM | POA: Diagnosis not present

## 2011-08-03 MED ORDER — WARFARIN SODIUM 5 MG PO TABS
5.0000 mg | ORAL_TABLET | Freq: Once | ORAL | Status: AC
  Administered 2011-08-03: 5 mg via ORAL
  Filled 2011-08-03: qty 1

## 2011-08-03 MED ORDER — MORPHINE SULFATE 2 MG/ML IJ SOLN
2.0000 mg | INTRAMUSCULAR | Status: DC | PRN
  Administered 2011-08-05 – 2011-08-07 (×3): 2 mg via INTRAVENOUS
  Filled 2011-08-03 (×3): qty 1

## 2011-08-03 MED ORDER — OXYCODONE HCL 5 MG PO TABS
10.0000 mg | ORAL_TABLET | ORAL | Status: DC | PRN
  Administered 2011-08-03 – 2011-08-04 (×5): 10 mg via ORAL
  Filled 2011-08-03 (×5): qty 2

## 2011-08-03 MED ORDER — PATIENT'S GUIDE TO USING COUMADIN BOOK
Freq: Once | Status: DC
  Filled 2011-08-03: qty 1
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
## 2011-08-03 NOTE — Progress Notes (Signed)
Physical Therapy Treatment Patient Details Name: Regina Rosales MRN: [id number] DOB: February 23, 1951 Today's Date: 08/03/2011  PT Assessment/Plan  PT - Assessment/Plan Comments on Treatment Session: Pt improving with her seated tolerance as well as her seated balance. She declined transfers today due to possibly going to a SNF this afternoon. PT Plan: Discharge plan remains appropriate;Frequency remains appropriate PT Frequency: Min 5X/week Follow Up Recommendations: Inpatient Rehab;Skilled nursing facility Equipment Recommended: Defer to next venue PT Goals  Acute Rehab PT Goals PT Goal Formulation: With patient PT Goal: Supine/Side to Sit - Progress: Progressing toward goal PT Goal: Sit to Supine/Side - Progress: Progressing toward goal  PT Treatment Precautions/Restrictions  Precautions Precautions: Fall Restrictions Weight Bearing Restrictions: Yes RLE Weight Bearing: Non weight bearing LLE Weight Bearing: Non weight bearing Other Position/Activity Restrictions: External fixator Mobility (including Balance) Bed Mobility Bed Mobility: Yes Rolling Right: 4: Min assist;With rail Rolling Right Details (indicate cue type and reason): VC for hand placement and sequencing.  Right Sidelying to Sit: 4: Min assist;With rails;HOB elevated (comment degrees) (30) Right Sidelying to Sit Details (indicate cue type and reason): VC for sequencing. Minimal assist of RLE only. Pt able to move LLE and trunk without physical assist Sitting - Scoot to Edge of Bed: 5: Supervision;With rail Sitting - Scoot to Edge of Bed Details (indicate cue type and reason): VC for hand placement. Pt able to complete without any physical assist. Cues for weight shifting. Transfers Transfers: No  Static Sitting Balance Static Sitting - Balance Support: Bilateral upper extremity supported (Right foot supported for comfort) Static Sitting - Level of Assistance: 5: Stand by assistance Static Sitting - Comment/# of  Minutes: 10 (throughout all exercises and hygiene) Exercise  General Exercises - Lower Extremity Long Arc Quad: AROM;Strengthening;Left;10 reps;Seated Hip Flexion/Marching: AROM;Strengthening;Left;10 reps;Seated Other Exercises Other Exercises: 10 active dorsiflexion on L ankle. Was not able to gain full ROM End of Session PT - End of Session Activity Tolerance: Patient tolerated treatment well Patient left: in bed;with call bell in reach (seated on side of bed) Nurse Communication: Mobility status for transfers;Mobility status for ambulation;Need for lift equipment General Behavior During Session: [facility] for tasks performed Cognition: [facility] for tasks performed  Milana Kidney 08/03/2011, 1:23 PM  08/03/2011 Milana Kidney DPT PAGER: [phone] OFFICE: [phone]

## 2011-08-03 NOTE — Progress Notes (Signed)
awaiting SNF Patient examined and I agree with the assessment and plan  Regina Rosales E

## 2011-08-04 LAB — PROTIME-INR: INR: 1.62 — ABNORMAL HIGH (ref 0.00–1.49)

## 2011-08-04 MED ORDER — WARFARIN SODIUM 2.5 MG PO TABS
2.5000 mg | ORAL_TABLET | Freq: Once | ORAL | Status: AC
  Administered 2011-08-04: 2.5 mg via ORAL
  Filled 2011-08-04: qty 1

## 2011-08-04 MED ORDER — OXYCODONE HCL 5 MG PO TABS
5.0000 mg | ORAL_TABLET | ORAL | Status: DC | PRN
  Administered 2011-08-04: 15 mg via ORAL
  Administered 2011-08-04 (×2): 10 mg via ORAL
  Administered 2011-08-05: 15 mg via ORAL
  Administered 2011-08-05 (×4): 10 mg via ORAL
  Administered 2011-08-06 – 2011-08-09 (×17): 15 mg via ORAL
  Administered 2011-08-09: 10 mg via ORAL
  Administered 2011-08-09 (×2): 15 mg via ORAL
  Administered 2011-08-09 – 2011-08-10 (×2): 10 mg via ORAL
  Administered 2011-08-10 – 2011-08-11 (×5): 15 mg via ORAL
  Administered 2011-08-11: 10 mg via ORAL
  Administered 2011-08-11: 15 mg via ORAL
  Administered 2011-08-12 – 2011-08-13 (×9): 10 mg via ORAL
  Filled 2011-08-04 (×4): qty 3
  Filled 2011-08-04: qty 2
  Filled 2011-08-04: qty 1
  Filled 2011-08-04 (×2): qty 2
  Filled 2011-08-04: qty 3
  Filled 2011-08-04 (×4): qty 2
  Filled 2011-08-04: qty 3
  Filled 2011-08-04 (×2): qty 2
  Filled 2011-08-04 (×2): qty 3
  Filled 2011-08-04: qty 2
  Filled 2011-08-04 (×2): qty 3
  Filled 2011-08-04: qty 15
  Filled 2011-08-04: qty 3
  Filled 2011-08-04 (×2): qty 2
  Filled 2011-08-04 (×5): qty 3
  Filled 2011-08-04: qty 2
  Filled 2011-08-04: qty 3
  Filled 2011-08-04: qty 2
  Filled 2011-08-04 (×2): qty 3
  Filled 2011-08-04 (×3): qty 2
  Filled 2011-08-04 (×3): qty 3
  Filled 2011-08-04: qty 2
  Filled 2011-08-04: qty 1
  Filled 2011-08-04: qty 3
  Filled 2011-08-04: qty 2
  Filled 2011-08-04: qty 3
  Filled 2011-08-04: qty 2
  Filled 2011-08-04: qty 3
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
## 2011-08-04 NOTE — Progress Notes (Signed)
Physical Therapy Treatment Patient Details Name: Regina Rosales MRN: [id number] DOB: 1950-11-08 Today's Date: 08/04/2011  PT Assessment/Plan  PT - Assessment/Plan Comments on Treatment Session: Focused this treatment session on ROM of right knee and ankle and left knee.  Pt is doing well tolerating sitting EOB.  Continue to follow until d/c to SNF, pt still hoping a bed will come available in [facility]. somewhere. PT Plan: Discharge plan remains appropriate;Frequency remains appropriate PT Frequency: Min 5X/week Follow Up Recommendations: Inpatient Rehab;Skilled nursing facility Equipment Recommended: Defer to next venue PT Goals  Acute Rehab PT Goals PT Goal Formulation: With patient Time For Goal Achievement: 2 weeks Pt will go Supine/Side to Sit: with supervision;with HOB 0 degrees PT Goal: Supine/Side to Sit - Progress: Progressing toward goal Pt will go Sit to Supine/Side: with min assist;with HOB 0 degrees PT Goal: Sit to Supine/Side - Progress: Progressing toward goal PT Goal: Stand to Sit - Progress: Discontinued (comment) (NWB bilateral LE) Pt will Transfer Bed to Chair/Chair to Bed: with mod assist PT Transfer Goal: Bed to Chair/Chair to Bed - Progress: Other (comment) (NT) Pt will Propel Wheelchair: > 150 feet;with supervision PT Goal: Propel Wheelchair - Progress:  (NT) Additional Goals Additional Goal #1: Pt will achieve 75 degrees PROM right knee flexion and 90 degrees AROM left knee flexion PT Goal: Additional Goal #1 - Progress: Progressing toward goal Additional Goal #2: Pt will achieve 90 degrees left ankle df PT Goal: Additional Goal #2 - Progress: Progressing toward goal Additional Goal #3: Pt will achieve 90 degrees left ankle df PT Goal: Additional Goal #3 - Progress: Progressing toward goal  PT Treatment Precautions/Restrictions  Precautions Precautions: Fall Required Braces or Orthoses: Yes Other Brace/Splint: Bledsoe on right, left dorsiflexion splint  on left Restrictions Weight Bearing Restrictions: Yes RLE Weight Bearing: Non weight bearing LLE Weight Bearing: Non weight bearing Other Position/Activity Restrictions: External fixator Mobility (including Balance) Bed Mobility Bed Mobility: Yes Rolling Right: 4: Min assist;With rail Rolling Right Details (indicate cue type and reason): pt moving left leg on her own but still unable to move right LE, min A to support it for roll Right Sidelying to Sit: HOB flat;4: Min assist;With rails Right Sidelying to Sit Details (indicate cue type and reason): min A to support right LE Sitting - Scoot to Edge of Bed: 5: Supervision;With rail Sitting - Scoot to Edge of Bed Details (indicate cue type and reason): able to scoot one hip at a time fwd as well as back Scooting to [facility]: 1: +2 Total assist;With trapeze;Patient percentage (comment) (50%) Scooting to [facility] Details (indicate cue type and reason): experienced right knee pain with scooting to [facility] Transfers Transfers: No Anterior-Posterior Transfer Details (indicate cue type and reason): again, pt was expected to leave today but has run into more hold-ups with transfer to facility.  Next visit plan to practice transfers and possibly w/c mobility.. Ambulation/Gait Ambulation/Gait: No Stairs: No Wheelchair Mobility Wheelchair Mobility: No  Posture/Postural Control Posture/Postural Control: No significant limitations Balance Balance Assessed: Yes Dynamic Sitting Balance Dynamic Sitting - Level of Assistance: 5: Stand by assistance Dynamic Sitting - Balance Activities: Other (comment);Trunk control activities;Forward lean/weight shifting (LE activities) Exercise  General Exercises - Lower Extremity Ankle Circles/Pumps: AROM;Both;Seated;20 reps Long Arc Quad: AROM;Strengthening;Left;10 reps;Seated Heel Slides: Left;AROM;Supine;10 reps Other Exercises Other Exercises: right knee flexion stretch x39min at varying degrees of flexion, with passive  knee extension afterwards.  Achieved 50 degrees flexion Other Exercises: right ankle df stretch x passive and 2  min AAROM with sheet.  Pt achieving only 80 degrees df on right. Other Exercises: left knee flexion stretch x , active and passive End of Session PT - End of Session Equipment Utilized During Treatment: Other (comment) (bledsoe brace and dorsiflexion splint) Activity Tolerance: Patient tolerated treatment well Patient left: in bed;with call bell in reach General Behavior During Session: [facility] for tasks performed Cognition: [facility] for tasks performed  Tondalaya Perren, [place]  [phone] 08/04/2011, 3:22 PM
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
## 2011-08-04 NOTE — Progress Notes (Signed)
This patient has been seen and I agree with the findings and treatment plan.  Ersilia Brawley O. Dhani Dannemiller, III, MD, FACS [phone] (pager) [phone] (direct pager) Trauma Surgeon  
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
## 2011-08-05 ENCOUNTER — Encounter (HOSPITAL_COMMUNITY): Payer: Self-pay | Admitting: Orthopedic Surgery

## 2011-08-05 LAB — PROTIME-INR: Prothrombin Time: 23.8 s — ABNORMAL HIGH (ref 11.6–15.2)

## 2011-08-05 MED ORDER — WARFARIN SODIUM 2.5 MG PO TABS
2.5000 mg | ORAL_TABLET | Freq: Once | ORAL | Status: AC
  Administered 2011-08-05: 2.5 mg via ORAL
  Filled 2011-08-05: qty 1

## 2011-08-05 NOTE — Progress Notes (Signed)
This patient has been seen and I agree with the findings and treatment plan.  Jaidy Cottam O. Silas Sedam, III, MD, FACS [phone] (pager) [phone] (direct pager) Trauma Surgeon  

## 2011-08-05 NOTE — Progress Notes (Signed)
Physical Therapy Treatment Patient Details Name: Regina Rosales MRN: [id number] DOB: 26-Apr-1951 Today's Date: 08/05/2011  PT Assessment/Plan  PT - Assessment/Plan Comments on Treatment Session: Pt with improved mobility getting to sitting at EOB and is able to maintain sitting for an increased time period without assist. Still working on increased knee flexion and dorsiflexion. Noted pt with clonus, 2-4 beats. RN and MD aware. PT Plan: Discharge plan remains appropriate;Frequency remains appropriate PT Frequency: Min 5X/week Follow Up Recommendations: Inpatient Rehab;Skilled nursing facility Equipment Recommended: Defer to next venue PT Goals  Acute Rehab PT Goals PT Goal Formulation: With patient Time For Goal Achievement: 2 weeks PT Goal: Supine/Side to Sit - Progress: Progressing toward goal PT Goal: Sit to Supine/Side - Progress: Progressing toward goal PT Transfer Goal: Bed to Chair/Chair to Bed - Progress: Other (comment) (NT) Additional Goals PT Goal: Additional Goal #1 - Progress: Progressing toward goal PT Goal: Additional Goal #2 - Progress: Progressing toward goal PT Goal: Additional Goal #3 - Progress: Progressing toward goal  PT Treatment Precautions/Restrictions  Precautions Precautions: Fall Required Braces or Orthoses: Yes Other Brace/Splint: Bledsoe on right, left dorsiflexion splint on left Restrictions Weight Bearing Restrictions: Yes RLE Weight Bearing: Non weight bearing LLE Weight Bearing: Non weight bearing Other Position/Activity Restrictions: External fixator Mobility (including Balance) Bed Mobility Bed Mobility: Yes Rolling Right: 5: Supervision;With rail Rolling Right Details (indicate cue type and reason): VC for hand placement and sequencing for hygiene Rolling Left: 5: Supervision Rolling Left Details (indicate cue type and reason): VC for hand placement and sequencing for hygiene Right Sidelying to Sit: HOB flat;4: Min assist;With rails Right  Sidelying to Sit Details (indicate cue type and reason): Assist of RLE only at end of transfer. Pt improving Supine to Sit: 4: Min assist;HOB flat;With rails Supine to Sit Details (indicate cue type and reason): Assist with RLE. VC for sequencing. Pt able to assist with RLE Sitting - Scoot to Edge of Bed: 5: Supervision;With rail Sitting - Scoot to Edge of Bed Details (indicate cue type and reason): VC for sequencing Scooting to HOB: 3: Mod assist;With trapeze Scooting to [facility] Details (indicate cue type and reason): Assist of drawsheet with pt holding onto trapeze. VC for sequencing Transfers Transfers: No  Balance Balance Assessed: Yes Static Sitting Balance Static Sitting - Balance Support: No upper extremity supported;Feet unsupported Static Sitting - Level of Assistance: 5: Stand by assistance Static Sitting - Comment/# of Minutes: 10 Exercise  General Exercises - Lower Extremity Long Arc Quad: AAROM;Both;Strengthening;10 reps;Seated Straight Leg Raises: Right;10 reps;Strengthening;Supine;AAROM (unable to achieve full SLR with min assist) Hip Flexion/Marching: AROM;Strengthening;Left;10 reps;Seated Other Exercises Other Exercises: 10 active DF bilateral LEs End of Session PT - End of Session Equipment Utilized During Treatment: Other (comment) (bledsoe brace and dorsiflexion splint) Activity Tolerance: Patient tolerated treatment well Patient left: in bed;with call bell in reach Nurse Communication: Mobility status for transfers;Mobility status for ambulation;Need for lift equipment;Other (comment) (clonus) General Behavior During Session: [facility] for tasks performed Cognition: [facility] for tasks performed  Milana Kidney 08/05/2011, 12:53 PM  08/05/2011 Milana Kidney DPT PAGER: [phone] OFFICE: [phone]

## 2011-08-05 NOTE — Progress Notes (Signed)
Occupational Therapy Treatment  Patient Details Name: Regina Rosales MRN: [id number] DOB: Apr 12, 1951 Today's Date: 08/05/2011  OT Assessment/Plan OT Assessment/Plan Comments on Treatment Session: Pt has made significant improvements with bed mobility and tolerating EOB activity.  Pt declined OOB to chair anterior/posterior transfer.  Pt educated on importance of increasing I with functional transfer OOB.  OT Plan: Discharge plan remains appropriate OT Frequency: Min 2X/week Follow Up Recommendations: Inpatient Rehab;Skilled nursing facility Equipment Recommended: Defer to next venue OT Goals Acute Rehab OT Goals OT Goal Formulation: With patient Time For Goal Achievement: 2 weeks ADL Goals Pt Will Perform Grooming: with set-up;Sitting, edge of bed ADL Goal: Grooming - Progress: Met Pt Will Perform Upper Body Bathing: with set-up;with supervision;Sitting, edge of bed ADL Goal: Upper Body Bathing - Progress: Met Pt Will Perform Upper Body Dressing: with set-up;with supervision;Sitting, bed ADL Goal: Upper Body Dressing - Progress: Met Pt Will Perform Lower Body Dressing: with min assist;Sitting, bed;with adaptive equipment (sitting, leaning right and/or left) ADL Goal: Lower Body Dressing - Progress: Not met Pt Will Transfer to Toilet: with min assist;Anterior-posterior transfer;3-in-1;Maintaining weight bearing status ADL Goal: Toilet Transfer - Progress: Not met Pt Will Perform Toileting - Clothing Manipulation: with min assist;Sitting on 3-in-1 or toilet;with adaptive equipment (sitting, leaning right and/or left) ADL Goal: Toileting - Clothing Manipulation - Progress: Not met Pt Will Perform Toileting - Hygiene: with supervision;Sitting on 3-in-1 or toilet;Leaning right and/or left on 3-in-1/toilet ADL Goal: Toileting - Hygiene - Progress: Not met Additional ADL Goal #1: Pt will perform UB exercises x 10-15 minutes to increase strength and activity tolerance for ADLs and functional  tasks Miscellaneous OT Goals OT Goal: Miscellaneous Goal #1 - Progress: Met  OT Treatment Precautions/Restrictions  Precautions Precautions: Fall Required Braces or Orthoses: Yes Other Brace/Splint: Bledsoe on right, left dorsiflexion splint on left Restrictions Weight Bearing Restrictions: Yes RLE Weight Bearing: Non weight bearing LLE Weight Bearing: Non weight bearing   ADL ADL Grooming: Simulated;Set up Where Assessed - Grooming: Sitting, bed;Unsupported Upper Body Bathing: Simulated;Set up Where Assessed - Upper Body Bathing: Sitting, bed;Unsupported Upper Body Dressing: Simulated;Set up Where Assessed - Upper Body Dressing: Sitting, bed;Unsupported Mobility  Bed Mobility Bed Mobility: Yes Rolling Right: 5: Supervision;With rail Rolling Right Details (indicate cue type and reason): VC for hand placement and sequencing for hygiene Rolling Left: 5: Supervision Rolling Left Details (indicate cue type and reason): VC for hand placement and sequencing for hygiene Right Sidelying to Sit: HOB flat;4: Min assist;With rails Right Sidelying to Sit Details (indicate cue type and reason): Assist of RLE only at end of transfer. Pt improving Supine to Sit: 4: Min assist;HOB flat;With rails Supine to Sit Details (indicate cue type and reason): Assist with RLE. VC for sequencing. Pt able to assist with RLE Sitting - Scoot to Edge of Bed: 5: Supervision;With rail Sitting - Scoot to Edge of Bed Details (indicate cue type and reason): VC for sequencing Scooting to HOB: 3: Mod assist;With trapeze Scooting to [facility] Details (indicate cue type and reason): Assist of drawsheet with pt holding onto trapeze. VC for sequencing Exercises  End of Session General Behavior During Session: [facility] for tasks performed Cognition: [facility] for tasks performed  Cipriano Mile  08/05/2011, 1:25 PM 08/05/2011 Cipriano Mile OTR/L Pager [phone] Office [phone]
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
## 2011-08-06 LAB — PROTIME-INR
INR: 2.11 — ABNORMAL HIGH (ref 0.00–1.49)
Prothrombin Time: 24 s — ABNORMAL HIGH (ref 11.6–15.2)

## 2011-08-06 LAB — URINALYSIS, ROUTINE W REFLEX MICROSCOPIC
Bilirubin Urine: NEGATIVE
Ketones, ur: NEGATIVE mg/dL
Nitrite: POSITIVE — AB
Urobilinogen, UA: 1 mg/dL (ref 0.0–1.0)
pH: 6 (ref 5.0–8.0)

## 2011-08-06 LAB — URINE MICROSCOPIC-ADD ON

## 2011-08-06 MED ORDER — WARFARIN SODIUM 2.5 MG PO TABS
2.5000 mg | ORAL_TABLET | Freq: Once | ORAL | Status: AC
  Administered 2011-08-06: 2.5 mg via ORAL
  Filled 2011-08-06: qty 1

## 2011-08-06 MED ORDER — LIDOCAINE 5 % EX PTCH
1.0000 | MEDICATED_PATCH | CUTANEOUS | Status: DC
  Filled 2011-08-06 (×8): qty 1

## 2011-08-06 MED ORDER — CIPROFLOXACIN HCL 500 MG PO TABS
500.0000 mg | ORAL_TABLET | Freq: Two times a day (BID) | ORAL | Status: DC
  Administered 2011-08-06 – 2011-08-13 (×14): 500 mg via ORAL
  Filled 2011-08-06 (×16): qty 1

## 2011-08-06 MED ORDER — METHOCARBAMOL 750 MG PO TABS
1500.0000 mg | ORAL_TABLET | Freq: Four times a day (QID) | ORAL | Status: DC
  Administered 2011-08-06 – 2011-08-13 (×29): 1500 mg via ORAL
  Filled 2011-08-06 (×35): qty 2
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
## 2011-08-06 NOTE — Progress Notes (Signed)
Physical Therapy Treatment Patient Details Name: Regina Rosales MRN: [id number] DOB: 1951/04/03 Today's Date: 08/06/2011  PT Assessment/Plan  PT - Assessment/Plan Comments on Treatment Session: Pt requested to be lifted back to bed secondary to fatigue from being in chair for almost two hours. Assisted pt with getting on/off of bedpan as well as moving up in bed for comfort. PT Plan: Discharge plan remains appropriate;Frequency remains appropriate PT Frequency: Min 5X/week Follow Up Recommendations: Inpatient Rehab;Skilled nursing facility Equipment Recommended: Defer to next venue PT Goals  Acute Rehab PT Goals PT Goal Formulation: With patient PT Goal: Supine/Side to Sit - Progress: Progressing toward goal PT Goal: Sit to Supine/Side - Progress: Progressing toward goal PT Transfer Goal: Bed to Chair/Chair to Bed - Progress: Progressing toward goal Additional Goals PT Goal: Additional Goal #1 - Progress: Progressing toward goal PT Goal: Additional Goal #2 - Progress: Progressing toward goal PT Goal: Additional Goal #3 - Progress: Progressing toward goal  PT Treatment Precautions/Restrictions  Precautions Precautions: Fall Required Braces or Orthoses: Yes Other Brace/Splint: Bledsoe on right, left dorsiflexion splint on left Restrictions Weight Bearing Restrictions: Yes RLE Weight Bearing: Non weight bearing LLE Weight Bearing: Non weight bearing Other Position/Activity Restrictions: External fixator Mobility (including Balance) Bed Mobility Bed Mobility: Yes Rolling Right: With rail;6: Modified independent (Device/Increase time) Rolling Right Details (indicate cue type and reason): VC for hand placement Rolling Left: 6: Modified independent (Device/Increase time);With rail Rolling Left Details (indicate cue type and reason): VC for hand placement Supine to Sit: 4: Min assist;HOB elevated (Comment degrees);With rails (20) Supine to Sit Details (indicate cue type and reason):  Minimal cueing needed for sequencing. No trunk control needed. min assist with RLE only Sitting - Scoot to Edge of Bed: 5: Supervision;With rail Sitting - Scoot to Edge of Bed Details (indicate cue type and reason): VC for weight shifting in order to scoot to edge of bed for long sitting position in transition for A-P transfer Scooting to [facility]: 3: Mod assist;With trapeze Scooting to [facility] Details (indicate cue type and reason): Drawsheet assist with trapeze to get to [facility]. VC for hand placement and sequencing Transfers Transfers: Yes Anterior-Posterior Transfer: 3: Mod assist;To level surface Anterior-Posterior Transfer Details (indicate cue type and reason): Pt able to complete 75% of transfer with only LE assist for support. Minimal assist with drawsheet when minimal surface change. Transfer via Lift Equipment: Maximove (pt fatigued from sitting in chair; lifted back to bed) Ambulation/Gait Ambulation/Gait: No  Balance Balance Assessed: Yes Static Sitting Balance Static Sitting - Balance Support: No upper extremity supported;Feet supported Static Sitting - Level of Assistance: 5: Stand by assistance Static Sitting - Comment/# of Minutes: 5 (pt able to sit in long sitting without any back support) Exercise  General Exercises - Lower Extremity Gluteal Sets: AROM;Strengthening;Both;10 reps;Supine Long Arc Quad: AROM;Strengthening;Left;Seated Heel Slides: AROM;Strengthening;Both;10 reps;Supine Straight Leg Raises: Both;AAROM;AROM;Strengthening;Supine;10 reps (Right AAROM; Left AROM) Other Exercises Other Exercises: 60 second dorsiflexion stretch right with 30 second rest; 3 reps Other Exercises: 10 reps active DF left End of Session PT - End of Session Equipment Utilized During Treatment:  (bledsoe brace on RLE and dorsiflexion splint on LLE) Activity Tolerance: Patient tolerated treatment well Patient left: with call bell in reach;in bed Nurse Communication: Mobility status for  transfers;Mobility status for ambulation;Need for lift equipment;Other (comment) General Behavior During Session: [facility] for tasks performed Cognition: [facility] for tasks performed  Milana Kidney 08/06/2011, 2:48 PM  08/06/2011 Milana Kidney DPT PAGER: [phone] OFFICE: [phone]

## 2011-08-06 NOTE — Progress Notes (Signed)
Physical Therapy Treatment Patient Details Name: Regina Rosales MRN: [id number] DOB: 04/07/1951 Today's Date: 08/06/2011  PT Assessment/Plan  PT - Assessment/Plan Comments on Treatment Session: Pt able to assist more with A-P transfer this session. Pt has made great improvement with UE strengthening; focusing on LE strength and flexibility. PT Plan: Discharge plan remains appropriate;Frequency remains appropriate PT Frequency: Min 5X/week Follow Up Recommendations: Inpatient Rehab;Skilled nursing facility Equipment Recommended: Defer to next venue PT Goals  Acute Rehab PT Goals PT Goal Formulation: With patient PT Goal: Supine/Side to Sit - Progress: Progressing toward goal PT Goal: Sit to Supine/Side - Progress: Progressing toward goal PT Transfer Goal: Bed to Chair/Chair to Bed - Progress: Progressing toward goal Additional Goals PT Goal: Additional Goal #1 - Progress: Progressing toward goal PT Goal: Additional Goal #2 - Progress: Progressing toward goal PT Goal: Additional Goal #3 - Progress: Progressing toward goal  PT Treatment Precautions/Restrictions  Precautions Precautions: Fall Required Braces or Orthoses: Yes Other Brace/Splint: Bledsoe on right, left dorsiflexion splint on left Restrictions Weight Bearing Restrictions: Yes RLE Weight Bearing: Non weight bearing LLE Weight Bearing: Non weight bearing Other Position/Activity Restrictions: External fixator Mobility (including Balance) Bed Mobility Bed Mobility: Yes Rolling Right: 5: Supervision;With rail Rolling Right Details (indicate cue type and reason): VC for hand placement Rolling Left: 5: Supervision Rolling Left Details (indicate cue type and reason): VC for hand placement Supine to Sit: 4: Min assist;HOB elevated (Comment degrees);With rails (20) Supine to Sit Details (indicate cue type and reason): Minimal cueing needed for sequencing. No trunk control needed. min assist with RLE only Sitting - Scoot to  Edge of Bed: 5: Supervision;With rail Sitting - Scoot to Edge of Bed Details (indicate cue type and reason): VC for weight shifting in order to scoot to edge of bed for long sitting position in transition for A-P transfer Transfers Transfers: Yes Anterior-Posterior Transfer: 3: Mod assist;To level surface Anterior-Posterior Transfer Details (indicate cue type and reason): Pt able to complete 75% of transfer with only LE assist for support. Minimal assist with drawsheet when minimal surface change. Ambulation/Gait Ambulation/Gait: No  Balance Balance Assessed: Yes Static Sitting Balance Static Sitting - Balance Support: No upper extremity supported;Feet supported Static Sitting - Level of Assistance: 5: Stand by assistance Static Sitting - Comment/# of Minutes: 5 (pt able to sit in long sitting without any back support) Exercise  General Exercises - Lower Extremity Gluteal Sets: AROM;Strengthening;Both;10 reps;Supine Long Arc Quad: AROM;Strengthening;Left;Seated Heel Slides: AROM;Strengthening;Both;10 reps;Supine Straight Leg Raises: Both;AAROM;AROM;Strengthening;Supine;10 reps (Right AAROM; Left AROM) Other Exercises Other Exercises: 60 second dorsiflexion stretch right with 30 second rest; 3 reps Other Exercises: 10 reps active DF left End of Session PT - End of Session Equipment Utilized During Treatment: Other (comment) (bledsoe brace and dorsiflexion splint) Activity Tolerance: Patient tolerated treatment well Patient left: with call bell in reach;in chair Nurse Communication: Mobility status for transfers;Mobility status for ambulation;Need for lift equipment;Other (comment) General Behavior During Session: [facility] for tasks performed Cognition: [facility] for tasks performed  Milana Kidney 08/06/2011, 2:06 PM  08/06/2011 Milana Kidney DPT PAGER: [phone] OFFICE: [phone]

## 2011-08-06 NOTE — Progress Notes (Signed)
New complaints reviewed with plan as above. Patient examined and I agree with the assessment and plan  Regina Rosales
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
## 2011-08-07 LAB — PROTIME-INR
INR: 2.5 — ABNORMAL HIGH (ref 0.00–1.49)
Prothrombin Time: 27.4 s — ABNORMAL HIGH (ref 11.6–15.2)

## 2011-08-07 MED ORDER — PREGABALIN 75 MG PO CAPS
75.0000 mg | ORAL_CAPSULE | Freq: Two times a day (BID) | ORAL | Status: DC
  Administered 2011-08-07 – 2011-08-13 (×13): 75 mg via ORAL
  Filled 2011-08-07 (×13): qty 1

## 2011-08-07 MED ORDER — WARFARIN SODIUM 2.5 MG PO TABS
2.5000 mg | ORAL_TABLET | Freq: Once | ORAL | Status: AC
  Administered 2011-08-07: 2.5 mg via ORAL
  Filled 2011-08-07: qty 1
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
## 2011-08-07 NOTE — Progress Notes (Signed)
Physical Therapy Treatment Patient Details Name: Regina Rosales MRN: [id number] DOB: [birth year] Today's Date: 08/07/2011  PT Assessment/Plan  PT - Assessment/Plan Comments on Treatment Session: Pt progressing well with sitting balance as well as mobility into sitting. Continue with mobility of knee and ankle movement to prevent contractures PT Plan: Discharge plan remains appropriate;Frequency remains appropriate PT Frequency: Min 5X/week Follow Up Recommendations: Inpatient Rehab;Skilled nursing facility Equipment Recommended: Defer to next venue PT Goals  Acute Rehab PT Goals PT Goal Formulation: With patient PT Goal: Supine/Side to Sit - Progress: Progressing toward goal PT Goal: Sit to Supine/Side - Progress: Progressing toward goal PT Goal: Stand to Sit - Progress: Progressing toward goal PT Goal: Propel Wheelchair - Progress: Other (comment) (NT)  PT Treatment Precautions/Restrictions  Precautions Precautions: Fall Required Braces or Orthoses: Yes Other Brace/Splint: Bledsoe on right, left dorsiflexion splint on left Restrictions Weight Bearing Restrictions: Yes RLE Weight Bearing: Non weight bearing LLE Weight Bearing: Non weight bearing Other Position/Activity Restrictions: External fixator Mobility (including Balance) Bed Mobility Bed Mobility: Yes Rolling Right: With rail;6: Modified independent (Device/Increase time) Rolling Left: 6: Modified independent (Device/Increase time);With rail Supine to Sit: 4: Min assist;HOB flat Supine to Sit Details (indicate cue type and reason): Pt able to complete sit without physical trunk assist or RLE. Assist of weight of LLE only with cueing throughout for proper sequencing Sitting - Scoot to Edge of Bed: 5: Supervision;With rail Sitting - Scoot to Edge of Bed Details (indicate cue type and reason): VC for hand placement. Supervision for safety. Pt able to scoot without any physical assist Transfers Transfers: No   Balance Balance Assessed: Yes Static Sitting Balance Static Sitting - Balance Support: No upper extremity supported;Feet unsupported Static Sitting - Level of Assistance: 5: Stand by assistance Static Sitting - Comment/# of Minutes: 10 Exercise  General Exercises - Lower Extremity Long Arc Quad: AROM;Strengthening;Left;Seated;10 reps Other Exercises Other Exercises: 60 second dorsiflexion stretch right with 30 second rest; 3 reps Other Exercises: 10 reps active DF left Other Exercises: AAROM LAQ Right, 1/2 range End of Session PT - End of Session Equipment Utilized During Treatment:  (bledsoe brace on RLE and dorsiflexion splint on LLE) Activity Tolerance: Patient tolerated treatment well Patient left: with call bell in reach;in bed Nurse Communication: Mobility status for transfers;Mobility status for ambulation;Need for lift equipment;Other (comment) General Behavior During Session: [facility] for tasks performed Cognition: [facility] for tasks performed  Milana Kidney 08/07/2011, 1:15 PM  08/07/2011 Milana Kidney DPT PAGER: [phone] OFFICE: [phone]
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
## 2011-08-08 LAB — PROTIME-INR
INR: 2.73 — ABNORMAL HIGH (ref 0.00–1.49)
Prothrombin Time: 29.4 s — ABNORMAL HIGH (ref 11.6–15.2)

## 2011-08-08 MED ORDER — WARFARIN SODIUM 2 MG PO TABS
2.0000 mg | ORAL_TABLET | Freq: Once | ORAL | Status: AC
  Administered 2011-08-08: 2 mg via ORAL
  Filled 2011-08-08: qty 1
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
## 2011-08-09 LAB — PROTIME-INR: INR: 2.03 — ABNORMAL HIGH (ref 0.00–1.49)

## 2011-08-09 MED ORDER — WARFARIN SODIUM 4 MG PO TABS
4.0000 mg | ORAL_TABLET | Freq: Once | ORAL | Status: AC
  Administered 2011-08-09: 4 mg via ORAL
  Filled 2011-08-09: qty 1

## 2011-08-09 NOTE — Plan of Care (Signed)
Problem: Phase III Progression Outcomes Goal: Ambulate BID with assist as able Outcome: Not Applicable Date Met:  08/09/11 Pt is NWB bilateral LEs
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
## 2011-08-09 NOTE — Progress Notes (Signed)
Patient examined and I agree with the assessment and plan  Anant Agard E  

## 2011-08-09 NOTE — Progress Notes (Signed)
Physical Therapy Treatment Patient Details Name: Regina Rosales MRN: [id number] DOB: [birth year] Today's Date: 08/09/2011  PT Assessment/Plan  PT - Assessment/Plan Comments on Treatment Session: Pt fatigued from previous session and increased time in wheelchair, therefore unable to attempt sliding board back into bed. Assisted pt with rolling to clean backside as well as repositioned on side for comfort to relieve pressure off backside. Pt at a supervision level for wheelchair management, still requires assist for parts management PT Plan: Discharge plan remains appropriate;Frequency remains appropriate PT Frequency: Min 5X/week Follow Up Recommendations: Inpatient Rehab;Skilled nursing facility Equipment Recommended: Defer to next venue PT Goals  Acute Rehab PT Goals PT Goal Formulation: With patient PT Goal: Supine/Side to Sit - Progress: Progressing toward goal PT Goal: Sit to Supine/Side - Progress: Progressing toward goal PT Goal: Stand to Sit - Progress: Discontinued (comment) PT Transfer Goal: Bed to Chair/Chair to Bed - Progress: Progressing toward goal PT Goal: Propel Wheelchair - Progress: Progressing toward goal Additional Goals PT Goal: Additional Goal #1 - Progress: Progressing toward goal PT Goal: Additional Goal #2 - Progress: Progressing toward goal PT Goal: Additional Goal #3 - Progress: Progressing toward goal  PT Treatment Precautions/Restrictions  Precautions Precautions: Fall Required Braces or Orthoses: Yes Other Brace/Splint: Bledsoe on right, left dorsiflexion splint on left Restrictions Weight Bearing Restrictions: Yes RLE Weight Bearing: Non weight bearing LLE Weight Bearing: Non weight bearing Other Position/Activity Restrictions: External fixator Mobility (including Balance) Bed Mobility Bed Mobility: Yes Rolling Left: 5: Supervision Rolling Left Details (indicate cue type and reason): VC for leg and hand placement. Pt left in left sidelying  position. Supine to Sit: 4: Min assist;HOB flat Supine to Sit Details (indicate cue type and reason): Assist of RLE only. Pt able to complete without use of trapeze and more UE strength Sitting - Scoot to Edge of Bed: 6: Modified independent (Device/Increase time) Sit to Supine - Left: 3: Mod assist Sit to Supine - Left Details (indicate cue type and reason): Pt fatigued from increased sitting in WC, therefore increased assistance needed to get back into supine from sitting position. assist of bilateral LEs and minimal trunk assist for control Transfers Transfers: Yes Lateral/Scoot Transfers: With slide board;1: +2 Total assist;Patient percentage (comment) (Pt 50%) Lateral/Scoot Transfer Details (indicate cue type and reason): Transfer from bed to wheelchair. Assist of one with LES to assist with maintaining NWB status. Assist of other for trunk control. VCs throughout for hand palcement and sequencing. Transfer via Lift Equipment: Maximove (pt too fatigued to attempt sliding board back to chair) Naval [employer] Mobility: Yes Wheelchair Assistance: 5: Supervision Wheelchair Assistance Details (indicate cue type and reason): VC for propulsion technique and turning. No physical assist needed Wheelchair Propulsion: Both upper extremities Wheelchair Parts Management: Needs assistance Distance: 50 (intermittently)    Exercise  General Exercises - Lower Extremity Long Arc Quad: AROM;Strengthening;Left;Seated;10 reps Heel Slides: AROM;Strengthening;Both;10 reps;Supine Straight Leg Raises: Both;AAROM;AROM;Strengthening;Supine;10 reps (AAROM with leg lifter on RLE; AROM LLE) Other Exercises Other Exercises: 10 reps active DF left Other Exercises: 10 reps active/active assisted DF right End of Session PT - End of Session Equipment Utilized During Treatment: Other (comment) (Maximove) Activity Tolerance: Patient tolerated treatment well Patient left: with call bell in reach;in  bed Nurse Communication: Mobility status for transfers;Mobility status for ambulation;Need for lift equipment;Other (comment) General Behavior During Session: [facility] for tasks performed Cognition: [facility] for tasks performed  Milana Kidney 08/09/2011, 2:02 PM  08/09/2011 Milana Kidney DPT PAGER: [phone] OFFICE: [phone]

## 2011-08-09 NOTE — Progress Notes (Signed)
Physical Therapy Treatment Patient Details Name: Regina Rosales MRN: [id number] DOB: [birth year] Today's Date: 08/09/2011  PT Assessment/Plan  PT - Assessment/Plan Comments on Treatment Session: Pt educated and performed sliding board transfer. Pt hesitant to transfer secondary to fear but was able to complete with assist. Will continue sliding board transfers and wheelchair management. PT Plan: Discharge plan remains appropriate;Frequency remains appropriate PT Frequency: Min 5X/week Follow Up Recommendations: Inpatient Rehab;Skilled nursing facility Equipment Recommended: Defer to next venue PT Goals  Acute Rehab PT Goals PT Goal Formulation: With patient PT Goal: Supine/Side to Sit - Progress: Progressing toward goal PT Goal: Sit to Supine/Side - Progress: Progressing toward goal PT Goal: Stand to Sit - Progress: Discontinued (comment) PT Transfer Goal: Bed to Chair/Chair to Bed - Progress: Progressing toward goal PT Goal: Propel Wheelchair - Progress: Progressing toward goal  PT Treatment Precautions/Restrictions  Precautions Precautions: Fall Required Braces or Orthoses: Yes Other Brace/Splint: Bledsoe on right, left dorsiflexion splint on left Restrictions Weight Bearing Restrictions: Yes RLE Weight Bearing: Non weight bearing LLE Weight Bearing: Non weight bearing Other Position/Activity Restrictions: External fixator Mobility (including Balance) Bed Mobility Bed Mobility: Yes Supine to Sit: 4: Min assist;HOB flat Supine to Sit Details (indicate cue type and reason): Assist of RLE only. Pt able to complete without use of trapeze and more UE strength Sitting - Scoot to Edge of Bed: 6: Modified independent (Device/Increase time) Transfers Transfers: Yes Lateral/Scoot Transfers: With slide board;1: +2 Total assist;Patient percentage (comment) (Pt 50%) Lateral/Scoot Transfer Details (indicate cue type and reason): Transfer from bed to wheelchair. Assist of one with LES to  assist with maintaining NWB status. Assist of other for trunk control. VCs throughout for hand palcement and sequencing. Corporate treasurer: Yes Wheelchair Assistance: 4: Administrator, sports Details (indicate cue type and reason): VC for sequencing for propulsion.  Wheelchair Propulsion: Both upper extremities Wheelchair Parts Management: Needs assistance Distance: 10 ft (around room )    Exercise  General Exercises - Lower Extremity Long Arc Quad: AROM;Strengthening;Left;Seated;10 reps Heel Slides: AROM;Strengthening;Both;10 reps;Supine Straight Leg Raises: Both;AAROM;AROM;Strengthening;Supine;10 reps (AAROM with leg lifter on RLE; AROM LLE) Other Exercises Other Exercises: 10 reps active DF left Other Exercises: 10 reps active/active assisted DF right End of Session PT - End of Session Equipment Utilized During Treatment: Gait belt Activity Tolerance: Patient tolerated treatment well Patient left: in chair;with call bell in reach (in [facility]) Nurse Communication: Mobility status for transfers;Mobility status for ambulation;Need for lift equipment;Other (comment) General Behavior During Session: [facility] for tasks performed Cognition: [facility] for tasks performed  Milana Kidney 08/09/2011, 1:00 PM  08/09/2011 Milana Kidney DPT PAGER: [phone] OFFICE: [phone]
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
## 2011-08-10 LAB — PROTIME-INR
INR: 1.79 — ABNORMAL HIGH (ref 0.00–1.49)
Prothrombin Time: 21.1 s — ABNORMAL HIGH (ref 11.6–15.2)

## 2011-08-10 MED ORDER — WARFARIN SODIUM 4 MG PO TABS
4.0000 mg | ORAL_TABLET | Freq: Once | ORAL | Status: AC
  Administered 2011-08-10: 4 mg via ORAL
  Filled 2011-08-10: qty 1
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
## 2011-08-11 LAB — PROTIME-INR
INR: 1.83 — ABNORMAL HIGH (ref 0.00–1.49)
Prothrombin Time: 21.5 s — ABNORMAL HIGH (ref 11.6–15.2)

## 2011-08-11 MED ORDER — WARFARIN SODIUM 4 MG PO TABS
4.0000 mg | ORAL_TABLET | Freq: Once | ORAL | Status: AC
  Administered 2011-08-11: 4 mg via ORAL
  Filled 2011-08-11: qty 1
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
## 2011-08-11 NOTE — Progress Notes (Signed)
Agree Ami Mally E  
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
## 2011-08-11 NOTE — Progress Notes (Signed)
Physical Therapy Treatment Patient Details Name: Regina Rosales MRN: [id number] DOB: [birth year] Today's Date: 08/11/2011  PT Assessment/Plan  PT - Assessment/Plan Comments on Treatment Session: Pt is improving her independent mobility. Pt was able to complete supine <-> sit at edge of bed with no physical assist. Pt has improved her trunk and UE strength. Will attempt transfer to and from wheelchair or chair next session. PT Plan: Discharge plan remains appropriate;Frequency remains appropriate PT Frequency: Min 5X/week Follow Up Recommendations: Inpatient Rehab;Skilled nursing facility Equipment Recommended: Defer to next venue PT Goals  Acute Rehab PT Goals PT Goal Formulation: With patient PT Goal: Supine/Side to Sit - Progress: Progressing toward goal PT Goal: Sit to Supine/Side - Progress: Progressing toward goal PT Transfer Goal: Bed to Chair/Chair to Bed - Progress: Other (comment) (NT) PT Goal: Propel Wheelchair - Progress: Other (comment) (NT) Additional Goals PT Goal: Additional Goal #1 - Progress: Partly met (90 degrees AROM right) PT Goal: Additional Goal #2 - Progress: Progressing toward goal  PT Treatment Precautions/Restrictions  Precautions Precautions: Fall Required Braces or Orthoses: Yes Other Brace/Splint: Bledsoe on right, left dorsiflexion splint on left Restrictions Weight Bearing Restrictions: Yes RLE Weight Bearing: Non weight bearing LLE Weight Bearing: Non weight bearing Other Position/Activity Restrictions: External fixator Mobility (including Balance) Bed Mobility Bed Mobility: Yes Supine to Sit: 5: Supervision;HOB elevated (Comment degrees) (20) Supine to Sit Details (indicate cue type and reason): VC for sequencing with leg lifter. Pt was able to compelte transfer without any physical assist with leg lifter on RLE and moving LLE independently.  Sitting - Scoot to Edge of Bed: 6: Modified independent (Device/Increase time) Sitting - Scoot to Edge of  Bed Details (indicate cue type and reason): Pt able to complete weight shifts to get to edge of bed without any physical assist Sit to Supine - Left: 5: Supervision;HOB flat Sit to Supine - Left Details (indicate cue type and reason): VC for technique and sequencing. Pt used leg lifter for RLE, requiring only physical assist at the very end for support secondary to position of bed. Scooting to [facility]: 5: Supervision;With rail Scooting to [facility] Details (indicate cue type and reason): VC for hand placement on rails in order for pt to assist self to head of bed. Pt scoots herself up in bed with UE assist only Transfers Transfers: No    Exercise  General Exercises - Lower Extremity Long Arc Quad: AROM;Strengthening;Both;10 reps;Seated (unable to achieve full ROM on RLE) Straight Leg Raises: AROM;Strengthening;Both;10 reps;Supine;AAROM (used leg lifter as assist with RLE) Hip Flexion/Marching: AROM;Strengthening;Both;10 reps;Seated Other Exercises Other Exercises: 10 reps active DF both LEs End of Session PT - End of Session Equipment Utilized During Treatment: Other (comment) (bledsoe brace and dorsiflexion splint) Activity Tolerance: Patient tolerated treatment well Patient left: with call bell in reach;in bed Nurse Communication: Mobility status for transfers;Mobility status for ambulation;Need for lift equipment;Other (comment) General Behavior During Session: [facility] for tasks performed Cognition: [facility] for tasks performed  Milana Kidney 08/11/2011, 3:27 PM  08/11/2011 Milana Kidney DPT PAGER: [phone] OFFICE: [phone]

## 2011-08-12 MED ORDER — WARFARIN SODIUM 4 MG PO TABS
4.0000 mg | ORAL_TABLET | Freq: Every day | ORAL | Status: DC
  Administered 2011-08-12: 4 mg via ORAL
  Filled 2011-08-12 (×2): qty 1

## 2011-08-12 NOTE — Progress Notes (Signed)
Physical Therapy Treatment Patient Details Name: Regina Rosales MRN: [id number] DOB: [birth year] Today's Date: 08/12/2011  PT Assessment/Plan  PT - Assessment/Plan Comments on Treatment Session: Pt still improving her UE and trunk strength to become more independent with transfers. Pt still requires assist with A-P transfers, continue prior to transferring to commode PT Plan: Discharge plan remains appropriate;Frequency remains appropriate PT Frequency: Min 5X/week Follow Up Recommendations: Inpatient Rehab;Skilled nursing facility Equipment Recommended: Defer to next venue PT Goals  Acute Rehab PT Goals PT Goal Formulation: With patient PT Goal: Supine/Side to Sit - Progress: Met PT Goal: Sit to Supine/Side - Progress: Met PT Transfer Goal: Bed to Chair/Chair to Bed - Progress: Met PT Goal: Propel Wheelchair - Progress: Met Additional Goals PT Goal: Additional Goal #1 - Progress: Partly met PT Goal: Additional Goal #2 - Progress: Progressing toward goal PT Goal: Additional Goal #3 - Progress: Progressing toward goal  PT Treatment Precautions/Restrictions  Precautions Precautions: Fall Required Braces or Orthoses: Yes Other Brace/Splint: Bledsoe on right, left dorsiflexion splint on left Restrictions Weight Bearing Restrictions: Yes RLE Weight Bearing: Non weight bearing LLE Weight Bearing: Non weight bearing Other Position/Activity Restrictions: External fixator Mobility (including Balance) Bed Mobility Bed Mobility: Yes Supine to Sit: HOB flat;6: Modified independent (Device/Increase time) Supine to Sit Details (indicate cue type and reason): Device(leg lifter) used on RLE to assist pt into sitting Sitting - Scoot to Edge of Bed: 6: Modified independent (Device/Increase time) Transfers Transfers: Yes Anterior-Posterior Transfer: 4: Min assist;To level surface Anterior-Posterior Transfer Details (indicate cue type and reason): Two transfers completed, to<-> bed to chair and  from bed to wheelchair. Pt able to complete most of transfer with only verbal cues for technique. Minimal assist of RLE towards end of transfer and increased assist with RLE with transfer anteriorly. Pt with improved UE strength to complete transfer with only minimal assist.  Corporate treasurer: Yes Wheelchair Assistance: 5: Supervision Wheelchair Assistance Details (indicate cue type and reason): Supervision for safety and technique Geophysical data processor: Both upper extremities Wheelchair Parts Management: Needs assistance Distance: 100 ft (around floor)    Exercise    End of Session PT - End of Session Equipment Utilized During Treatment: Other (comment) (bledsoe brace and dorsiflexion splint)) Activity Tolerance: Patient tolerated treatment well Patient left: with call bell in reach;in chair Nurse Communication: Mobility status for transfers;Mobility status for ambulation;Need for lift equipment General Behavior During Session: [facility] for tasks performed Cognition: [facility] for tasks performed  Milana Kidney 08/12/2011, 12:40 PM  08/12/2011 Milana Kidney DPT PAGER: [phone] OFFICE: [phone]
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
## 2011-08-12 NOTE — Progress Notes (Signed)
Clinical Social Worker continuing to stay in touch with facility in regards to insurance authorization with patient BCBS.  CSW sent updated clinicals to [facility] and awaiting response.  MD notified.  Clinical Social Worker will continue to be involved for discharge planning needs.  [address] [place], [place] [phone]
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
## 2011-08-12 NOTE — Progress Notes (Signed)
Occupational Therapy Treatment Patient Details Name: Regina Rosales MRN: [id number] DOB: [birth year] Today's Date: 08/12/2011  OT Assessment/Plan OT Assessment/Plan Comments on Treatment Session: Pt has made significant improvement with A-P transfer.  Pt demonstrating increased upper body strength during transfers.  Will continue to work on A-P transfer to and from 3in1. Equipment Recommended: Defer to next venue OT Goals ADL Goals Pt Will Perform Grooming: with set-up;Sitting, edge of bed ADL Goal: Grooming - Progress: Met Pt Will Transfer to Toilet: with min assist;Anterior-posterior transfer;3-in-1;Maintaining weight bearing status ADL Goal: Toilet Transfer - Progress: Progressing toward goals Additional ADL Goal #1: Pt will perform UB exercises x 10-15 minutes to increase strength and activity tolerance for ADLs and functional tasks Miscellaneous OT Goals OT Goal: Miscellaneous Goal #1 - Progress: Progressing toward goals  OT Treatment Precautions/Restrictions  Restrictions Weight Bearing Restrictions: Yes RLE Weight Bearing: Non weight bearing LLE Weight Bearing: Non weight bearing   ADL ADL Toilet Transfer: Simulated;Minimal assistance Toilet Transfer Details (indicate cue type and reason): Pt able to complete most of transfer with only verbal cues for technique. Minimal assist of RLE towards end of transfer and increased assist with RLE with transfer anteriorly. Pt transferred from bed to w/c.   Toilet Transfer Method: Anterior-posterior Mobility  Bed Mobility Bed Mobility: Yes Supine to Sit: HOB flat;6: Modified independent (Device/Increase time) Supine to Sit Details (indicate cue type and reason): Device(leg lifter) used on RLE to assist pt into sitting Sitting - Scoot to Edge of Bed: 6: Modified independent (Device/Increase time) Exercises    End of Session OT - End of Session Activity Tolerance: Patient limited by fatigue Patient left: Other (comment) (performing  w/c mobility in hallway) Nurse Communication: Other (comment) (RN made aware that pt was maneuvering w/c chair on hallway) General Behavior During Session: [facility] for tasks performed Cognition: [facility] for tasks performed  Cipriano Mile  08/12/2011, 12:48 PM 08/12/2011 Cipriano Mile OTR/L Pager [phone] Office [phone]

## 2011-08-13 LAB — PROTIME-INR
INR: 2.53 — ABNORMAL HIGH (ref 0.00–1.49)
Prothrombin Time: 27.7 s — ABNORMAL HIGH (ref 11.6–15.2)

## 2011-08-13 MED ORDER — METHOCARBAMOL 750 MG PO TABS: 1500.0000 mg | ORAL_TABLET | Freq: Four times a day (QID) | ORAL | Status: AC

## 2011-08-13 MED ORDER — LIDOCAINE 5 % EX PTCH: 1.0000 | MEDICATED_PATCH | CUTANEOUS | Status: AC

## 2011-08-13 MED ORDER — WARFARIN SODIUM 3 MG PO TABS: 3.0000 mg | ORAL_TABLET | Freq: Once | ORAL | Status: DC

## 2011-08-13 MED ORDER — PREGABALIN 75 MG PO CAPS: 75.0000 mg | ORAL_CAPSULE | Freq: Two times a day (BID) | ORAL | Status: DC

## 2011-08-13 MED ORDER — FE FUMARATE-B12-VIT C-FA-IFC PO CAPS: 1.0000 | ORAL_CAPSULE | Freq: Three times a day (TID) | ORAL | Status: DC

## 2011-08-13 MED ORDER — OXYCODONE HCL 5 MG PO TABS: 5.0000 mg | ORAL_TABLET | ORAL | Status: AC | PRN

## 2011-08-13 MED ORDER — METHOCARBAMOL 500 MG PO TABS: 1000.0000 mg | ORAL_TABLET | Freq: Every evening | ORAL | Status: AC | PRN

## 2011-08-13 MED ORDER — DSS 100 MG PO CAPS: 100.0000 mg | ORAL_CAPSULE | Freq: Two times a day (BID) | ORAL | Status: AC

## 2011-08-13 MED ORDER — METHOCARBAMOL 500 MG PO TABS: 1000.0000 mg | ORAL_TABLET | Freq: Every evening | ORAL | Status: DC | PRN

## 2011-08-13 MED ORDER — POLYETHYLENE GLYCOL 3350 17 G PO PACK: 17.0000 g | PACK | Freq: Every day | ORAL | Status: AC

## 2011-08-13 MED ORDER — WARFARIN SODIUM 3 MG PO TABS
3.0000 mg | ORAL_TABLET | Freq: Once | ORAL | Status: DC
  Filled 2011-08-13: qty 1
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
## 2011-08-13 NOTE — Progress Notes (Signed)
Clinical Social Worker facilitated patient discharge to [facility] including arranging ambulance transport.  Patient is unhappy with facility option due to location.  Patient understands that she is medically stable and ready for discharge.  Patient was hopeful for placement in [facility] however due to insurance concerns, there was no bed availability.  Patient was involved in discharge planning and is aware of transfer to [facility] at this time.  Clinical Social Worker will sign off for now as social work intervention is no longer needed. Please consult [place] again if new need arises.  [address] [place], [place] [phone]

## 2011-08-13 NOTE — Progress Notes (Signed)
Physical Therapy Treatment Patient Details Name: Regina Rosales MRN: [id number] DOB: 05/28/1951 Today's Date: 08/13/2011  PT Assessment/Plan  PT - Assessment/Plan Comments on Treatment Session: Pt required increased assistance today with a transfer due to the difficulty on a non-level surface. Pt plan to d/c to SNF today PT Plan: Discharge plan remains appropriate;Frequency remains appropriate PT Frequency: Min 5X/week Follow Up Recommendations: Inpatient Rehab;Skilled nursing facility Equipment Recommended: Defer to next venue PT Goals  Acute Rehab PT Goals PT Goal Formulation: With patient PT Goal: Supine/Side to Sit - Progress: Met PT Goal: Sit to Supine/Side - Progress: Met PT Transfer Goal: Bed to Chair/Chair to Bed - Progress: Met PT Goal: Propel Wheelchair - Progress: Met Additional Goals PT Goal: Additional Goal #1 - Progress: Partly met PT Goal: Additional Goal #2 - Progress: Progressing toward goal PT Goal: Additional Goal #3 - Progress: Progressing toward goal  PT Treatment Precautions/Restrictions  Precautions Precautions: Fall Required Braces or Orthoses: Yes Other Brace/Splint: Bledsoe on right, left dorsiflexion splint on left Restrictions Weight Bearing Restrictions: Yes RLE Weight Bearing: Non weight bearing LLE Weight Bearing: Non weight bearing Other Position/Activity Restrictions: External fixator Mobility (including Balance) Bed Mobility Bed Mobility: Yes Rolling Right: 6: Modified independent (Device/Increase time) Rolling Left: 6: Modified independent (Device/Increase time) Supine to Sit: HOB flat;6: Modified independent (Device/Increase time) Sitting - Scoot to Edge of Bed: 6: Modified independent (Device/Increase time) Transfers Transfers: Yes Anterior-Posterior Transfer: To level surface;1: +2 Total assist;Patient percentage (comment);Other (comment) (to 3-1(slight change in surface), Pt 40%) Anterior-Posterior Transfer Details (indicate cue type  and reason): Assist given with transfer onto commode. Pt able to complete transfer with min assist of LLE although requires increased assistance with the change in surface due to tricep weakness. Assist of two to get from bed to commode with trunk assist and LLE assist (Assist with chair pushup)    Exercise    End of Session PT - End of Session Equipment Utilized During Treatment: Gait belt Activity Tolerance: Patient tolerated treatment well Patient left: with call bell in reach;in bed Nurse Communication: Mobility status for transfers;Mobility status for ambulation;Need for lift equipment General Behavior During Session: [facility] for tasks performed Cognition: [facility] for tasks performed  Milana Kidney 08/13/2011, 3:13 PM  08/13/2011 Milana Kidney DPT PAGER: [phone] OFFICE: [phone]

## 2011-08-13 NOTE — Progress Notes (Signed)
Pt asks for muscle relaxant in middle of night...will order prn med.  Stop cipro.  I saw the patient, participated in the history, exam and medical decision making, and concur with the physician assistant's note above.  Mary Sella. Andrey Campanile, MD, FACS General, Bariatric, & Minimally Invasive Surgery [facility], [place]
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
## 2011-08-13 NOTE — Progress Notes (Signed)
Occupational Therapy Treatment Patient Details Name: Regina Rosales MRN: [id number] DOB: 1951-04-11 Today's Date: 08/13/2011  OT Assessment/Plan OT Assessment/Plan OT Plan: Discharge plan remains appropriate OT Frequency: Min 2X/week Follow Up Recommendations: Skilled nursing facility Equipment Recommended: Defer to next venue OT Goals Acute Rehab OT Goals OT Goal Formulation: With patient Time For Goal Achievement: 2 weeks ADL Goals Pt Will Perform Grooming: with set-up;Sitting, edge of bed Pt Will Perform Upper Body Bathing: with set-up;with supervision;Sitting, edge of bed Pt Will Perform Upper Body Dressing: with set-up;with supervision;Sitting, bed Pt Will Perform Lower Body Dressing: with min assist;Sitting, bed;with adaptive equipment Pt Will Transfer to Toilet: with min assist;Anterior-posterior transfer;3-in-1;Maintaining weight bearing status ADL Goal: Toilet Transfer - Progress: Progressing toward goals Pt Will Perform Toileting - Clothing Manipulation: with min assist;Sitting on 3-in-1 or toilet;with adaptive equipment ADL Goal: Toileting - Clothing Manipulation - Progress: Progressing toward goals Pt Will Perform Toileting - Hygiene: with supervision;Sitting on 3-in-1 or toilet;Leaning right and/or left on 3-in-1/toilet ADL Goal: Toileting - Hygiene - Progress: Progressing toward goals Additional ADL Goal #1: Pt will perform UB exercises x 10-15 minutes to increase strength and activity tolerance for ADLs and functional tasks  OT Treatment Precautions/Restrictions  Precautions Precautions: Fall Required Braces or Orthoses: Yes Other Brace/Splint: Bledsoe on right, left dorsiflexion splint on left Restrictions Weight Bearing Restrictions: Yes RLE Weight Bearing: Non weight bearing LLE Weight Bearing: Non weight bearing Other Position/Activity Restrictions: External fixator   ADL ADL Toilet Transfer: Performed;+2 Total assistance;Comment for patient % (50) Toilet  Transfer Details (indicate cue type and reason): Pt able to get to EOB and begin scooting buttock onto 3N1. pt using bil UE to push down performing chair push up to A with transfer. pt unable to clear buttock off bed surface with chair pushup. Pt Aed with scooting backwards.  Toilet Transfer Method: Clinical biochemist: Raised toilet seat with arms (or 3-in-1 over toilet) Toileting - Clothing Manipulation: +1 Total assistance Where Assessed - Toileting Clothing Manipulation: Sit to stand from 3-in-1 or toilet Toileting - Hygiene: +1 Total assistance Where Assessed - Toileting Hygiene: Sit on 3-in-1 or toilet ADL Comments: PT applying lotion to bil LE in sitting position down to knees. Pt with redness on buttock and barrier cream applied with lotion. Mobility  Bed Mobility Bed Mobility: Yes Supine to Sit: HOB flat;6: Modified independent (Device/Increase time) Sitting - Scoot to Edge of Bed: 6: Modified independent (Device/Increase time) Exercises    End of Session OT - End of Session Activity Tolerance: Patient limited by fatigue Patient left: in bed;with call bell in reach Nurse Communication: Mobility status for transfers General Behavior During Session: [facility] for tasks performed Cognition: [facility] for tasks performed  Lucile Shutters  08/13/2011, 11:41 AM Pager: [phone]

## 2011-08-15 NOTE — Discharge Summary (Signed)
Regina Rosales M. Kenner Lewan, MD, FACS General, Bariatric, & Minimally Invasive Surgery [facility]  

## 2011-10-13 ENCOUNTER — Ambulatory Visit
Admission: RE | Admit: 2011-10-13 | Discharge: 2011-10-13 | Disposition: A | Discharge status: Home / Self Care | Payer: BC Managed Care – PPO | Source: Ambulatory Visit | Attending: Orthopedic Surgery | Admitting: Orthopedic Surgery

## 2011-10-13 ENCOUNTER — Other Ambulatory Visit: Payer: Self-pay | Admitting: Orthopedic Surgery

## 2011-10-13 DIAGNOSIS — M7989 Other specified soft tissue disorders: Secondary | ICD-10-CM

## 2012-04-12 ENCOUNTER — Encounter (HOSPITAL_COMMUNITY): Payer: Self-pay | Admitting: Pharmacy Technician

## 2012-04-13 ENCOUNTER — Encounter (HOSPITAL_COMMUNITY)
Admission: RE | Admit: 2012-04-13 | Discharge: 2012-04-13 | Disposition: A | Discharge status: Home / Self Care | Payer: BC Managed Care – PPO | Source: Ambulatory Visit | Attending: Orthopedic Surgery | Admitting: Orthopedic Surgery

## 2012-04-13 ENCOUNTER — Encounter (HOSPITAL_COMMUNITY): Payer: Self-pay

## 2012-04-13 HISTORY — DX: Other cervical disc degeneration, unspecified cervical region: M50.30

## 2012-04-13 LAB — SURGICAL PCR SCREEN
MRSA, PCR: NEGATIVE
Staphylococcus aureus: NEGATIVE

## 2012-04-13 LAB — CBC
Hemoglobin: 14 g/dL (ref 12.0–15.0)
RBC: 4.43 MIL/uL (ref 3.87–5.11)
WBC: 5.9 10*3/uL (ref 4.0–10.5)

## 2012-04-13 NOTE — Pre-Procedure Instructions (Signed)
20 Regina Rosales  04/13/2012   Your procedure is scheduled on:  Monday Sept 9  Report to [facility] at 0945 AM.  Call this number if you have problems the morning of surgery: [phone]   Remember:   Do not eat food or liquids:After Midnight.  May have clear liquids:until Midnight .   Take these medicines the morning of surgery with A SIP OF WATER: oxycodone   Do not wear jewelry, make-up or nail polish.  Do not wear lotions, powders, or perfumes. You may wear deodorant.  Do not shave 48 hours prior to surgery. Men may shave face and neck.  Do not bring valuables to the hospital.  Contacts, dentures or bridgework may not be worn into surgery.  Leave suitcase in the car. After surgery it may be brought to your room.  For patients admitted to the hospital, checkout time is 11:00 AM the day of discharge.   Patients discharged the day of surgery will not be allowed to drive home.  Name and phone number of your driver: pt to make arrangements for transportation  Special Instructions: CHG Shower Use Special Wash: 1/2 bottle night before surgery and 1/2 bottle morning of surgery.   Please read over the following fact sheets that you were given: Pain Booklet, Coughing and Deep Breathing, MRSA Information and Surgical Site Infection Prevention

## 2012-04-21 NOTE — Progress Notes (Signed)
Pt. Notified of time change,to arrive at 0800.

## 2012-04-23 MED ORDER — CEFAZOLIN SODIUM-DEXTROSE 2-3 GM-% IV SOLR
2.0000 g | INTRAVENOUS | Status: AC
  Administered 2012-04-24: 2 g via INTRAVENOUS
  Filled 2012-04-23: qty 50

## 2012-04-23 MED ORDER — CHLORHEXIDINE GLUCONATE 4 % EX LIQD: 60.0000 mL | Freq: Once | CUTANEOUS | Status: DC

## 2012-04-23 MED ORDER — LACTATED RINGERS IV SOLN
INTRAVENOUS | Status: DC
  Administered 2012-04-24: 09:00:00 via INTRAVENOUS

## 2012-04-24 ENCOUNTER — Encounter (HOSPITAL_COMMUNITY): Payer: Self-pay | Admitting: Orthopedic Surgery

## 2012-04-24 ENCOUNTER — Encounter (HOSPITAL_COMMUNITY): Payer: Self-pay | Admitting: Anesthesiology

## 2012-04-24 ENCOUNTER — Ambulatory Visit (HOSPITAL_COMMUNITY): Payer: BC Managed Care – PPO

## 2012-04-24 ENCOUNTER — Ambulatory Visit (HOSPITAL_COMMUNITY)
Admission: RE | Admit: 2012-04-24 | Discharge: 2012-04-25 | Disposition: A | Discharge status: Home / Self Care | Payer: BC Managed Care – PPO | Source: Ambulatory Visit | Attending: Orthopedic Surgery | Admitting: Orthopedic Surgery

## 2012-04-24 ENCOUNTER — Encounter (HOSPITAL_COMMUNITY)
Admission: RE | Disposition: A | Discharge status: Home / Self Care | Payer: Self-pay | Source: Ambulatory Visit | Attending: Orthopedic Surgery

## 2012-04-24 ENCOUNTER — Ambulatory Visit (HOSPITAL_COMMUNITY): Payer: BC Managed Care – PPO | Admitting: Anesthesiology

## 2012-04-24 DIAGNOSIS — Y834 Other reconstructive surgery as the cause of abnormal reaction of the patient, or of later complication, without mention of misadventure at the time of the procedure: Secondary | ICD-10-CM | POA: Insufficient documentation

## 2012-04-24 DIAGNOSIS — T85848A Pain due to other internal prosthetic devices, implants and grafts, initial encounter: Secondary | ICD-10-CM

## 2012-04-24 DIAGNOSIS — Z01812 Encounter for preprocedural laboratory examination: Secondary | ICD-10-CM | POA: Insufficient documentation

## 2012-04-24 DIAGNOSIS — M1710 Unilateral primary osteoarthritis, unspecified knee: Secondary | ICD-10-CM | POA: Insufficient documentation

## 2012-04-24 DIAGNOSIS — Z01810 Encounter for preprocedural cardiovascular examination: Secondary | ICD-10-CM | POA: Insufficient documentation

## 2012-04-24 DIAGNOSIS — Z01818 Encounter for other preprocedural examination: Secondary | ICD-10-CM | POA: Insufficient documentation

## 2012-04-24 DIAGNOSIS — T8489XA Other specified complication of internal orthopedic prosthetic devices, implants and grafts, initial encounter: Secondary | ICD-10-CM | POA: Insufficient documentation

## 2012-04-24 HISTORY — DX: Fracture of unspecified metatarsal bone(s), unspecified foot, initial encounter for closed fracture: S92.309A

## 2012-04-24 HISTORY — DX: Displaced bimalleolar fracture of right lower leg, initial encounter for closed fracture: S82.841A

## 2012-04-24 HISTORY — DX: Displaced fracture of medial condyle of right tibia, initial encounter for closed fracture: S82.131A

## 2012-04-24 HISTORY — DX: Multiple fractures of pelvis without disruption of pelvic ring, initial encounter for closed fracture: S32.82XA

## 2012-04-24 HISTORY — DX: Displaced supracondylar fracture with intracondylar extension of lower end of unspecified femur, initial encounter for closed fracture: S72.463A

## 2012-04-24 HISTORY — PX: HARDWARE REMOVAL: SHX979

## 2012-04-24 SURGERY — REMOVAL, HARDWARE
Anesthesia: General | Site: Leg Upper | Laterality: Right | Wound class: Clean

## 2012-04-24 MED ORDER — METOCLOPRAMIDE HCL 5 MG/ML IJ SOLN: 5.0000 mg | Freq: Three times a day (TID) | INTRAMUSCULAR | Status: DC | PRN

## 2012-04-24 MED ORDER — POTASSIUM CHLORIDE IN NACL 20-0.9 MEQ/L-% IV SOLN
INTRAVENOUS | Status: DC
  Administered 2012-04-24: 17:00:00 via INTRAVENOUS
  Filled 2012-04-24 (×3): qty 1000

## 2012-04-24 MED ORDER — DEXTROSE 5 % IV SOLN
1000.0000 mg | Freq: Four times a day (QID) | INTRAVENOUS | Status: DC
  Filled 2012-04-24 (×7): qty 10

## 2012-04-24 MED ORDER — KETOROLAC TROMETHAMINE 30 MG/ML IJ SOLN
30.0000 mg | Freq: Four times a day (QID) | INTRAMUSCULAR | Status: DC
  Administered 2012-04-24 – 2012-04-25 (×4): 30 mg via INTRAVENOUS
  Filled 2012-04-24 (×7): qty 1

## 2012-04-24 MED ORDER — LACTATED RINGERS IV SOLN
INTRAVENOUS | Status: DC | PRN
  Administered 2012-04-24: 10:00:00 via INTRAVENOUS

## 2012-04-24 MED ORDER — HYDROMORPHONE HCL PF 1 MG/ML IJ SOLN
INTRAMUSCULAR | Status: AC
  Filled 2012-04-24: qty 1

## 2012-04-24 MED ORDER — ONDANSETRON HCL 4 MG PO TABS: 4.0000 mg | ORAL_TABLET | Freq: Four times a day (QID) | ORAL | Status: DC | PRN

## 2012-04-24 MED ORDER — PROPOFOL 10 MG/ML IV EMUL
INTRAVENOUS | Status: DC | PRN
  Administered 2012-04-24: 160 mg via INTRAVENOUS

## 2012-04-24 MED ORDER — NEOSTIGMINE METHYLSULFATE 1 MG/ML IJ SOLN
INTRAMUSCULAR | Status: DC | PRN
  Administered 2012-04-24: 1.5 mg via INTRAVENOUS

## 2012-04-24 MED ORDER — DOCUSATE SODIUM 100 MG PO CAPS
100.0000 mg | ORAL_CAPSULE | Freq: Two times a day (BID) | ORAL | Status: DC
  Administered 2012-04-24 – 2012-04-25 (×2): 100 mg via ORAL
  Filled 2012-04-24 (×2): qty 1

## 2012-04-24 MED ORDER — OXYCODONE-ACETAMINOPHEN 5-325 MG PO TABS: 1.0000 | ORAL_TABLET | ORAL | Status: DC | PRN

## 2012-04-24 MED ORDER — ONDANSETRON HCL 4 MG/2ML IJ SOLN: 4.0000 mg | Freq: Four times a day (QID) | INTRAMUSCULAR | Status: DC | PRN

## 2012-04-24 MED ORDER — HYDROMORPHONE HCL PF 1 MG/ML IJ SOLN
0.5000 mg | INTRAMUSCULAR | Status: DC | PRN
Start: 2012-04-24 — End: 2012-04-25
  Administered 2012-04-25: 1 mg via INTRAVENOUS
  Filled 2012-04-24: qty 1

## 2012-04-24 MED ORDER — METOCLOPRAMIDE HCL 10 MG PO TABS: 5.0000 mg | ORAL_TABLET | Freq: Three times a day (TID) | ORAL | Status: DC | PRN

## 2012-04-24 MED ORDER — LIDOCAINE HCL 4 % MT SOLN
OROMUCOSAL | Status: DC | PRN
  Administered 2012-04-24: 4 mL via TOPICAL

## 2012-04-24 MED ORDER — FENTANYL CITRATE 0.05 MG/ML IJ SOLN
INTRAMUSCULAR | Status: DC | PRN
  Administered 2012-04-24: 25 ug via INTRAVENOUS
  Administered 2012-04-24 (×2): 50 ug via INTRAVENOUS
  Administered 2012-04-24: 25 ug via INTRAVENOUS
  Administered 2012-04-24 (×2): 50 ug via INTRAVENOUS

## 2012-04-24 MED ORDER — CEFAZOLIN SODIUM 1-5 GM-% IV SOLN
1.0000 g | Freq: Three times a day (TID) | INTRAVENOUS | Status: DC
  Administered 2012-04-24 – 2012-04-25 (×2): 1 g via INTRAVENOUS
  Filled 2012-04-24 (×4): qty 50

## 2012-04-24 MED ORDER — MIDAZOLAM HCL 5 MG/5ML IJ SOLN
INTRAMUSCULAR | Status: DC | PRN
  Administered 2012-04-24: 2 mg via INTRAVENOUS

## 2012-04-24 MED ORDER — HYDROMORPHONE HCL PF 1 MG/ML IJ SOLN
0.2500 mg | INTRAMUSCULAR | Status: DC | PRN
  Administered 2012-04-24 (×2): 0.25 mg via INTRAVENOUS
  Administered 2012-04-24: 0.5 mg via INTRAVENOUS

## 2012-04-24 MED ORDER — METHOCARBAMOL 500 MG PO TABS
1000.0000 mg | ORAL_TABLET | Freq: Four times a day (QID) | ORAL | Status: DC
  Administered 2012-04-24 – 2012-04-25 (×3): 1000 mg via ORAL
  Filled 2012-04-24 (×6): qty 2

## 2012-04-24 MED ORDER — BUPIVACAINE-EPINEPHRINE (PF) 0.5% -1:200000 IJ SOLN
INTRAMUSCULAR | Status: AC
  Filled 2012-04-24: qty 10

## 2012-04-24 MED ORDER — ROCURONIUM BROMIDE 100 MG/10ML IV SOLN
INTRAVENOUS | Status: DC | PRN
  Administered 2012-04-24: 50 mg via INTRAVENOUS

## 2012-04-24 MED ORDER — BUPIVACAINE-EPINEPHRINE 0.5% -1:200000 IJ SOLN
INTRAMUSCULAR | Status: DC | PRN
  Administered 2012-04-24: 15 mL

## 2012-04-24 MED ORDER — GLYCOPYRROLATE 0.2 MG/ML IJ SOLN
INTRAMUSCULAR | Status: DC | PRN
  Administered 2012-04-24: 0.2 mg via INTRAVENOUS

## 2012-04-24 MED ORDER — OXYCODONE HCL 5 MG PO TABS
5.0000 mg | ORAL_TABLET | ORAL | Status: DC | PRN
  Administered 2012-04-25: 10 mg via ORAL
  Filled 2012-04-24: qty 2

## 2012-04-24 MED ORDER — BSS IO SOLN
15.0000 mL | Freq: Once | INTRAOCULAR | Status: AC
  Administered 2012-04-24: 15 mL via INTRAOCULAR
  Filled 2012-04-24: qty 15

## 2012-04-24 MED ORDER — LIDOCAINE HCL (CARDIAC) 20 MG/ML IV SOLN
INTRAVENOUS | Status: DC | PRN
  Administered 2012-04-24: 70 mg via INTRAVENOUS

## 2012-04-24 MED ORDER — ONDANSETRON HCL 4 MG/2ML IJ SOLN
INTRAMUSCULAR | Status: DC | PRN
  Administered 2012-04-24: 4 mg via INTRAVENOUS

## 2012-04-24 MED ORDER — 0.9 % SODIUM CHLORIDE (POUR BTL) OPTIME
TOPICAL | Status: DC | PRN
  Administered 2012-04-24: 1000 mL

## 2012-04-24 MED ORDER — PHENYLEPHRINE HCL 10 MG/ML IJ SOLN
INTRAMUSCULAR | Status: DC | PRN
  Administered 2012-04-24: 80 ug via INTRAVENOUS

## 2012-04-24 SURGICAL SUPPLY — 69 items
BANDAGE ELASTIC 4 VELCRO ST LF (GAUZE/BANDAGES/DRESSINGS) IMPLANT
BANDAGE ELASTIC 6 VELCRO ST LF (GAUZE/BANDAGES/DRESSINGS) ×2 IMPLANT
BANDAGE ESMARK 6X9 LF (GAUZE/BANDAGES/DRESSINGS) ×1 IMPLANT
BANDAGE GAUZE ELAST BULKY 4 IN (GAUZE/BANDAGES/DRESSINGS) ×2 IMPLANT
BNDG COHESIVE 6X5 TAN STRL LF (GAUZE/BANDAGES/DRESSINGS) ×2 IMPLANT
BNDG ESMARK 6X9 LF (GAUZE/BANDAGES/DRESSINGS) ×2
BRUSH SCRUB DISP (MISCELLANEOUS) ×4 IMPLANT
CLEANER TIP ELECTROSURG 2X2 (MISCELLANEOUS) ×2 IMPLANT
CLOTH BEACON ORANGE TIMEOUT ST (SAFETY) ×2 IMPLANT
COVER SURGICAL LIGHT HANDLE (MISCELLANEOUS) ×2 IMPLANT
CUFF TOURNIQUET SINGLE 18IN (TOURNIQUET CUFF) IMPLANT
CUFF TOURNIQUET SINGLE 24IN (TOURNIQUET CUFF) IMPLANT
CUFF TOURNIQUET SINGLE 34IN LL (TOURNIQUET CUFF) IMPLANT
DRAPE C-ARM 42X72 X-RAY (DRAPES) ×2 IMPLANT
DRAPE C-ARMOR (DRAPES) ×2 IMPLANT
DRAPE INCISE IOBAN 66X45 STRL (DRAPES) ×2 IMPLANT
DRAPE OEC MINIVIEW 54X84 (DRAPES) IMPLANT
DRAPE U-SHAPE 47X51 STRL (DRAPES) ×2 IMPLANT
DRSG ADAPTIC 3X8 NADH LF (GAUZE/BANDAGES/DRESSINGS) ×2 IMPLANT
DRSG MEPILEX BORDER 4X4 (GAUZE/BANDAGES/DRESSINGS) ×2 IMPLANT
DRSG PAD ABDOMINAL 8X10 ST (GAUZE/BANDAGES/DRESSINGS) ×2 IMPLANT
ELECT REM PT RETURN 9FT ADLT (ELECTROSURGICAL) ×2
ELECTRODE REM PT RTRN 9FT ADLT (ELECTROSURGICAL) ×1 IMPLANT
EVACUATOR 1/8 PVC DRAIN (DRAIN) IMPLANT
GLOVE BIO SURGEON STRL SZ7.5 (GLOVE) ×2 IMPLANT
GLOVE BIO SURGEON STRL SZ8 (GLOVE) ×2 IMPLANT
GLOVE BIOGEL PI IND STRL 6.5 (GLOVE) ×1 IMPLANT
GLOVE BIOGEL PI IND STRL 7.0 (GLOVE) ×1 IMPLANT
GLOVE BIOGEL PI IND STRL 7.5 (GLOVE) ×1 IMPLANT
GLOVE BIOGEL PI IND STRL 8 (GLOVE) ×1 IMPLANT
GLOVE BIOGEL PI INDICATOR 6.5 (GLOVE) ×1
GLOVE BIOGEL PI INDICATOR 7.0 (GLOVE) ×1
GLOVE BIOGEL PI INDICATOR 7.5 (GLOVE) ×1
GLOVE BIOGEL PI INDICATOR 8 (GLOVE) ×1
GOWN PREVENTION PLUS XLARGE (GOWN DISPOSABLE) ×2 IMPLANT
GOWN STRL NON-REIN LRG LVL3 (GOWN DISPOSABLE) ×4 IMPLANT
KIT BASIN OR (CUSTOM PROCEDURE TRAY) ×2 IMPLANT
KIT ROOM TURNOVER OR (KITS) ×2 IMPLANT
MANIFOLD NEPTUNE II (INSTRUMENTS) ×2 IMPLANT
NEEDLE 22X1 1/2 (OR ONLY) (NEEDLE) ×2 IMPLANT
NS IRRIG 1000ML POUR BTL (IV SOLUTION) ×2 IMPLANT
PACK ORTHO EXTREMITY (CUSTOM PROCEDURE TRAY) ×2 IMPLANT
PAD ARMBOARD 7.5X6 YLW CONV (MISCELLANEOUS) ×4 IMPLANT
PADDING CAST ABS 6INX4YD NS (CAST SUPPLIES) ×1
PADDING CAST ABS COTTON 6X4 NS (CAST SUPPLIES) ×1 IMPLANT
PADDING CAST COTTON 6X4 STRL (CAST SUPPLIES) ×2 IMPLANT
PENCIL BUTTON HOLSTER BLD 10FT (ELECTRODE) ×2 IMPLANT
SLEEVE SURGEON STRL (DRAPES) ×2 IMPLANT
SPONGE GAUZE 4X4 12PLY (GAUZE/BANDAGES/DRESSINGS) ×2 IMPLANT
SPONGE LAP 18X18 X RAY DECT (DISPOSABLE) ×4 IMPLANT
SPONGE SCRUB IODOPHOR (GAUZE/BANDAGES/DRESSINGS) ×2 IMPLANT
STAPLER VISISTAT 35W (STAPLE) IMPLANT
STOCKINETTE IMPERVIOUS LG (DRAPES) ×2 IMPLANT
STRIP CLOSURE SKIN 1/2X4 (GAUZE/BANDAGES/DRESSINGS) IMPLANT
SUCTION FRAZIER TIP 10 FR DISP (SUCTIONS) IMPLANT
SUT ETHILON 3 0 PS 1 (SUTURE) ×6 IMPLANT
SUT PDS AB 2-0 CT1 27 (SUTURE) IMPLANT
SUT VIC AB 0 CT1 27 (SUTURE) ×2
SUT VIC AB 0 CT1 27XBRD ANBCTR (SUTURE) ×2 IMPLANT
SUT VIC AB 2-0 CT1 27 (SUTURE) ×2
SUT VIC AB 2-0 CT1 36 (SUTURE) ×2 IMPLANT
SUT VIC AB 2-0 CT1 TAPERPNT 27 (SUTURE) ×2 IMPLANT
SYR CONTROL 10ML LL (SYRINGE) ×4 IMPLANT
TOWEL OR 17X24 6PK STRL BLUE (TOWEL DISPOSABLE) ×4 IMPLANT
TOWEL OR 17X26 10 PK STRL BLUE (TOWEL DISPOSABLE) ×4 IMPLANT
TUBE CONNECTING 12X1/4 (SUCTIONS) ×2 IMPLANT
UNDERPAD 30X30 INCONTINENT (UNDERPADS AND DIAPERS) ×4 IMPLANT
WATER STERILE IRR 1000ML POUR (IV SOLUTION) IMPLANT
YANKAUER SUCT BULB TIP NO VENT (SUCTIONS) ×2 IMPLANT

## 2012-04-24 NOTE — Brief Op Note (Signed)
04/24/2012  12:53 PM  PATIENT:  Regina Rosales  61 y.o. female  PRE-OPERATIVE DIAGNOSIS:  SYMPTOMATIC HARDWARE RIGHT DISTAL FEMUR AND TIBIA  POST-OPERATIVE DIAGNOSIS: SYMPTOMATIC HARDWARE RIGHT DISTAL FEMUR AND TIBIA  PROCEDURE:  Procedure(s) (LRB) with comments: HARDWARE REMOVAL (Right) TIBIA HARDWARE REMOVAL RIGHT FEMUR  SURGEON:  Surgeon(s) and Role:    * Budd Palmer, MD - Primary  PHYSICIAN ASSISTANT: Montez Morita, [facility]  ANESTHESIA:   general  EBL:  Total I/O In: 800 [I.V.:800] Out: -   BLOOD ADMINISTERED:none  DRAINS: none   LOCAL MEDICATIONS USED:  MARCAINE     SPECIMEN:  No Specimen  DISPOSITION OF SPECIMEN:  N/A  COUNTS:  YES  TOURNIQUET:  * No tourniquets in log *  DICTATION: .Other Dictation: Dictation Number [id number]  PLAN OF CARE: Admit for overnight observation  PATIENT DISPOSITION:  PACU - hemodynamically stable.   Delay start of Pharmacological VTE agent (>24hrs) due to surgical blood loss or risk of bleeding: no

## 2012-04-24 NOTE — Transfer of Care (Signed)
Immediate Anesthesia Transfer of Care Note  Patient: Regina Rosales  Procedure(s) Performed: Procedure(s) (LRB) with comments: HARDWARE REMOVAL (Right)  Patient Location: PACU  Anesthesia Type: General  Level of Consciousness: awake, alert  and oriented  Airway & Oxygen Therapy: Patient Spontanous Breathing and Patient connected to face mask oxygen  Post-op Assessment: Report given to PACU RN  Post vital signs: Reviewed  Complications: No apparent anesthesia complications

## 2012-04-24 NOTE — Addendum Note (Signed)
Addendum  created 04/24/12 2111 by Rivka Barbara, MD   Modules edited:Notes Section

## 2012-04-24 NOTE — Anesthesia Preprocedure Evaluation (Signed)
Anesthesia Evaluation  Patient identified by MRN, date of birth, ID band Patient awake    Reviewed: Allergy & Precautions, H&P , NPO status , Patient's Chart, lab work & pertinent test results  Airway Mallampati: II  Neck ROM: full    Dental   Pulmonary former smoker,          Cardiovascular     Neuro/Psych    GI/Hepatic   Endo/Other  obese  Renal/GU      Musculoskeletal   Abdominal   Peds  Hematology   Anesthesia Other Findings   Reproductive/Obstetrics                           Anesthesia Physical Anesthesia Plan  ASA: II  Anesthesia Plan: General   Post-op Pain Management:    Induction: Intravenous  Airway Management Planned: Oral ETT  Additional Equipment:   Intra-op Plan:   Post-operative Plan: Extubation in OR  Informed Consent: I have reviewed the patients History and Physical, chart, labs and discussed the procedure including the risks, benefits and alternatives for the proposed anesthesia with the patient or authorized representative who has indicated his/her understanding and acceptance.     Plan Discussed with: CRNA and Surgeon  Anesthesia Plan Comments:         Anesthesia Quick Evaluation  

## 2012-04-24 NOTE — Progress Notes (Signed)
Patient was admitted from PACU c/o right eye irritation and burning. I looked for debris and redness, but did not see anything. Warm compresses were given to help with the pain and discomfort. The patient was having more pain in the eye than the surgical leg. The on-call PA was paged and made aware. Josh PA-C notified anesthesiology to evaluate.

## 2012-04-24 NOTE — Anesthesia Postprocedure Evaluation (Signed)
  Anesthesia Post-op Note  Patient: Regina Rosales  Procedure(s) Performed: Procedure(s) (LRB) with comments: HARDWARE REMOVAL (Right)  Patient Location: PACU  Anesthesia Type: General  Level of Consciousness: awake, alert , oriented and patient cooperative  Airway and Oxygen Therapy: Patient Spontanous Breathing  Post-op Pain: none  Post-op Assessment: Post-op Vital signs reviewed, Patient's Cardiovascular Status Stable, Respiratory Function Stable and No signs of Nausea or vomiting  Post-op Vital Signs: stable  C omplications:         Pt seen at 2000 for c/o eye irritation. Pt states feels like something on the surface of eye (floating) with mild irritation. Eye appears normal w/o redness or photophobia noted. Plan is to irrigate right eye w/ BSS . Pt accepts plan.  Post procedure note. Pt tolerated right eye irrigation very well w/o discomfort. Pt thinks the eye feels better. Will follow up. Maren Beach Md

## 2012-04-24 NOTE — Anesthesia Postprocedure Evaluation (Signed)
Anesthesia Post Note  Patient: Regina Rosales  Procedure(s) Performed: Procedure(s) (LRB): HARDWARE REMOVAL (Right)  Anesthesia type: General  Patient location: PACU  Post pain: Pain level controlled and Adequate analgesia  Post assessment: Post-op Vital signs reviewed, Patient's Cardiovascular Status Stable, Respiratory Function Stable, Patent Airway and Pain level controlled  Last Vitals:  Filed Vitals:   04/24/12 1342  BP: 114/54  Pulse: 53  Temp:   Resp: 11    Post vital signs: Reviewed and stable  Level of consciousness: awake, alert  and oriented  Complications: No apparent anesthesia complications
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
## 2012-04-24 NOTE — Preoperative (Signed)
Beta Blockers   Reason not to administer Beta Blockers:Not Applicable 

## 2012-04-24 NOTE — H&P (Signed)
I have seen and examined the patient. I agree with the findings above. I discussed with Mrs. Ohalloran the risks and benefits of surgery, including the possibility of infection, nerve injury, vessel injury, wound breakdown, occult nonunion with loss of reduction, failure to alleviate all symptoms, DVT/ PE, loss of motion, and need for further surgery among others.  We also specifically discussed the need to stage surgery because of the elevated risk of soft tissue breakdown that could lead to amputation.  She understood these risks and wished to proceed.  Budd Palmer, MD 04/24/2012 10:23 AM

## 2012-04-25 ENCOUNTER — Encounter (HOSPITAL_COMMUNITY): Payer: Self-pay | Admitting: Orthopedic Surgery

## 2012-04-25 DIAGNOSIS — T85848A Pain due to other internal prosthetic devices, implants and grafts, initial encounter: Secondary | ICD-10-CM

## 2012-04-25 MED ORDER — HYDROCODONE-ACETAMINOPHEN 7.5-325 MG PO TABS: 1.0000 | ORAL_TABLET | Freq: Four times a day (QID) | ORAL | Status: AC | PRN

## 2012-04-25 MED ORDER — METHOCARBAMOL 500 MG PO TABS: 500.0000 mg | ORAL_TABLET | Freq: Four times a day (QID) | ORAL | Status: AC | PRN
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
## 2012-04-25 NOTE — Discharge Summary (Signed)
[facility] (OTS)  Patient ID: Regina Rosales MRN: [id number] DOB/AGE: [birth year] 61 y.o.  Admit date: 04/24/2012 Discharge date: 04/25/2012  Admission Diagnoses: Symptomatic hardware R leg (femur and tibia)  Discharge Diagnoses:  Principal Problem:  *Pain from implanted hardware, right leg   Procedures Performed: Removal of hardware R leg  Discharged Condition: good  Hospital Course:   Observed overnight s/p HW removal. Pain control and therapies carried out.  Pt without difficulty. Stable for d/c on POD 1  Consults: None  Significant Diagnostic Studies: none  Treatments: IV hydration, antibiotics: Ancef, analgesia: Dilaudid, percocet, oxy ir, therapies: PT, OT and RN and surgery: as above   Discharge Exam:  Subjective:  1 Day Post-Op Procedure(s) (LRB):  HARDWARE REMOVAL (Right)  Doing well  Pain controlled  Ready to go home  No CP, No SOB  Notes some muscle spasms  Objective:  Current Vitals  Blood pressure 135/66, pulse 84, temperature 98.9 F (37.2 C), temperature source Oral, resp. rate 18, SpO2 100.00%.  Vital signs in last 24 hours:  Temp: [97 F (36.1 C)-99 F (37.2 C)] 98.9 F (37.2 C) (09/10 0712)  Pulse Rate: [43-90] 84 (09/10 0712)  Resp: [11-22] 18 (09/10 0712)  BP: (104-135)/(49-98) 135/66 mmHg (09/10 0712)  SpO2: [98 %-100 %] 100 % (09/10 0712)  Intake/Output from previous day:  09/09 0701 - 09/10 0700  In: 1770 [P.O.:720; I.V.:1050]  Out: 50 [Blood:50]  LABS  No results found for this basename: HGB:5 in the last 72 hours  No results found for this basename: WBC:2,RBC:2,HCT:2,PLT:2 in the last 72 hours  No results found for this basename: NA:2,K:2,CL:2,CO2:2,BUN:2,CREATININE:2,GLUCOSE:2,CALCIUM:2 in the last 72 hours  No results found for this basename: LABPT:2,INR:2 in the last 72 hours  Physical Exam  Gen:NAD, resting comfortably in bedside chair  Lungs:clear  Cardiac:reg  Abd:NT  Ext:  Right Leg  Dressings clean,  dry and intact  Ext is warm  Distal motor and sensory functions intact  Swelling stable  Imaging  Dg Femur Right  04/24/2012 *RADIOLOGY REPORT* Clinical Data: Symptomatic hardware distal femur and tibia. DG C-ARM 61-120 MIN,RIGHT FEMUR - 2 VIEW Comparison: 07/30/2011 Findings: C-arm films document hardware removal from the distal femur and proximal tibia. Healed distal femur fracture and tibial plateau fracture. No adverse features. IMPRESSION: As above. Original Report Authenticated By: Elsie Stain, M.D.  Dg Chest Portable 1 View  04/24/2012 *RADIOLOGY REPORT* Clinical Data: Femur fracture. Pre-op respiratory exam. PORTABLE CHEST - 1 VIEW Comparison: 04/13/2012 Findings: Low lung volumes are seen. There is mild atelectasis seen in the left lower lung which is new since previous study. No evidence of pulmonary consolidation or edema. No evidence of pleural effusion. Heart size and mediastinal contours are within normal limits. IMPRESSION: Low lung volumes with mild left lower lobe subsegmental atelectasis. Original Report Authenticated By: Danae Orleans, M.D.  Dg C-arm [phone] Min  04/24/2012 *RADIOLOGY REPORT* Clinical Data: Symptomatic hardware distal femur and tibia. DG C-ARM 61-120 MIN,RIGHT FEMUR - 2 VIEW Comparison: 07/30/2011 Findings: C-arm films document hardware removal from the distal femur and proximal tibia. Healed distal femur fracture and tibial plateau fracture. No adverse features. IMPRESSION: As above. Original Report Authenticated By: Elsie Stain, M.D.   Assessment/Plan:  1 Day Post-Op Procedure(s) (LRB):  HARDWARE REMOVAL (Right)  61 y/o female s/p HW removal R leg  1. ROH R leg  ROM as tolerated all joints  WBAT  Ice prn  Compressive wraps for swelling  Dressing  change in 2-3 days  Follow up in 14 days  2. DVT/PE prophylaxis  ASA daily x 4 weeks  3. Pain  Po meds  4. FEN  Diet as tolerated  5. Dispo  D/c home today   Disposition: 03-Skilled Nursing  Facility  Discharge Orders    Future Orders Please Complete By Expires   Diet general      Call MD / Call 911      Comments:   If you experience chest pain or shortness of breath, CALL 911 and be transported to the hospital emergency room.  If you develope a fever above 101 F, pus (white drainage) or increased drainage or redness at the wound, or calf pain, call your surgeon's office.   Constipation Prevention      Comments:   Drink plenty of fluids.  Prune juice may be helpful.  You may use a stool softener, such as Colace (over the counter) 100 mg twice a day.  Use MiraLax (over the counter) for constipation as needed.   Increase activity slowly as tolerated      Discharge instructions      Comments:   [facility]uma Service Discharge Instructions,   General Discharge Instructions  WEIGHT BEARING STATUS: weight bear as tolerated  RANGE OF MOTION/ACTIVITY: Activity as tolerated, Range of motion as tolerated  Diet: as you were eating previously.  Can use over the counter stool softeners and bowel preparations, such as Miralax, to help with bowel movements.  Narcotics can be constipating.  Be sure to drink plenty of fluids  STOP SMOKING OR USING NICOTINE PRODUCTS!!!!  As discussed nicotine severely impairs your body's ability to heal surgical and traumatic wounds but also impairs bone healing.  Wounds and bone heal by forming microscopic blood vessels (angiogenesis) and nicotine is a vasoconstrictor (essentially, shrinks blood vessels).  Therefore, if vasoconstriction occurs to these microscopic blood vessels they essentially disappear and are unable to deliver necessary nutrients to the healing tissue.  This is one modifiable factor that you can do to dramatically increase your chances of healing your injury.    (This means no smoking, no nicotine gum, patches, etc)  DO NOT USE NONSTEROIDAL ANTI-INFLAMMATORY DRUGS (NSAID'S)  Using products such as Advil (ibuprofen), Aleve  (naproxen), Motrin (ibuprofen) for additional pain control during fracture healing can delay and/or prevent the healing response.  If you would like to take over the counter (OTC) medication, Tylenol (acetaminophen) is ok.  However, some narcotic medications that are given for pain control contain acetaminophen as well. Therefore, you should not exceed more than 4000 mg of tylenol in a day if you do not have liver disease.  Also note that there are may OTC medicines, such as cold medicines and allergy medicines that my contain tylenol as well.  If you have any questions about medications and/or interactions please ask your doctor/PA or your pharmacist.   PAIN MEDICATION USE AND EXPECTATIONS  You have likely been given narcotic medications to help control your pain.  After a traumatic event that results in an fracture (broken bone) with or without surgery, it is ok to use narcotic pain medications to help control one's pain.  We understand that everyone responds to pain differently and each individual patient will be evaluated on a regular basis for the continued need for narcotic medications. Ideally, narcotic medication use should last no more than 6-8 weeks (coinciding with fracture healing).   As a patient it is your responsibility as well to monitor narcotic  medication use and report the amount and frequency you use these medications when you come to your office visit.   We would also advise that if you are using narcotic medications, you should take a dose prior to therapy to maximize you participation.  IF YOU ARE ON NARCOTIC MEDICATIONS IT IS NOT PERMISSIBLE TO OPERATE A MOTOR VEHICLE (MOTORCYCLE/CAR/TRUCK/MOPED) OR HEAVY MACHINERY DO NOT MIX NARCOTICS WITH OTHER CNS (CENTRAL NERVOUS SYSTEM) DEPRESSANTS SUCH AS ALCOHOL       ICE AND ELEVATE INJURED/OPERATIVE EXTREMITY  Using ice and elevating the injured extremity above your heart can help with swelling and pain control.  Icing in a pulsatile  fashion, such as 20 minutes on and 20 minutes off, can be followed.    Do not place ice directly on skin. Make sure there is a barrier between to skin and the ice pack.    Using frozen items such as frozen peas works well as the conform nicely to the are that needs to be iced.  USE AN ACE WRAP OR TED HOSE FOR SWELLING CONTROL  In addition to icing and elevation, Ace wraps or TED hose are used to help limit and resolve swelling.  It is recommended to use Ace wraps or TED hose until you are informed to stop.    When using Ace Wraps start the wrapping distally (farthest away from the body) and wrap proximally (closer to the body)   Example: If you had surgery on your leg or thing and you do not have a splint on, start the ace wrap at the toes and work your way up to the thigh        If you had surgery on your upper extremity and do not have a splint on, start the ace wrap at your fingers and work your way up to the upper arm  IF YOU ARE IN A SPLINT OR CAST DO NOT REMOVE IT FOR ANY REASON   If your splint gets wet for any reason please contact the office immediately. You may shower in your splint or cast as long as you keep it dry.  This can be done by wrapping in a cast cover or garbage back (or similar)  Do Not stick any thing down your splint or cast such as pencils, money, or hangers to try and scratch yourself with.  If you feel itchy take benadryl as prescribed on the bottle for itching  IF YOU ARE IN A CAM BOOT (BLACK BOOT)  You may remove boot periodically. Perform daily dressing changes as noted below.  Wash the liner of the boot regularly and wear a sock when wearing the boot. It is recommended that you sleep in the boot until told otherwise  CALL THE OFFICE WITH ANY QUESTIONS OR CONCERTS: [phone]     Discharge Pin Site Instructions  Dress pins daily with Kerlix roll starting on POD 2. Wrap the Kerlix so that it tamps the skin down around the pin-skin interface to prevent/limit  motion of the skin relative to the pin.  (Pin-skin motion is the primary cause of pain and infection related to external fixator pin sites).  Remove any crust or coagulum that may obstruct drainage with a saline moistened gauze or soap and water.  After POD 3, if there is no discernable drainage on the pin site dressing, the interval for change can by increased to every other day.  You may shower with the fixator, cleaning all pin sites gently with soap and water.  If you have a surgical wound this needs to be completely dry and without drainage before showering.  The extremity can be lifted by the fixator to facilitate wound care and transfers.  Notify the office/Doctor if you experience increasing drainage, redness, or pain from a pin site, or if you notice purulent (thick, snot-like) drainage.  Discharge Wound Care Instructions  Do NOT apply any ointments, solutions or lotions to pin sites or surgical wounds.  These prevent needed drainage and even though solutions like hydrogen peroxide kill bacteria, they also damage cells lining the pin sites that help fight infection.  Applying lotions or ointments can keep the wounds moist and can cause them to breakdown and open up as well. This can increase the risk for infection. When in doubt call the office.  Surgical incisions should be dressed daily.  If any drainage is noted, use one layer of adaptic, then gauze, Kerlix, and an ace wrap.  Once the incision is completely dry and without drainage, it may be left open to air out.  Showering may begin 36-48 hours later.  Cleaning gently with soap and water.  Traumatic wounds should be dressed daily as well.    One layer of adaptic, gauze, Kerlix, then ace wrap.  The adaptic can be discontinued once the draining has ceased    If you have a wet to dry dressing: wet the gauze with saline the squeeze as much saline out so the gauze is moist (not soaking wet), place moistened gauze over wound, then  place a dry gauze over the moist one, followed by Kerlix wrap, then ace wrap.   Weight bearing as tolerated        Medication List  As of 04/25/2012 12:17 PM   STOP taking these medications         oxyCODONE-acetaminophen 5-325 MG per tablet         TAKE these medications         HYDROcodone-acetaminophen 7.5-325 MG per tablet   Commonly known as: NORCO   Take 1-2 tablets by mouth every 6 (six) hours as needed for pain.      methocarbamol 500 MG tablet   Commonly known as: ROBAXIN   Take 1-2 tablets (500-1,000 mg total) by mouth every 6 (six) hours as needed (spasm).           Follow-up Information    Follow up with HANDY,MICHAEL H, MD. Schedule an appointment as soon as possible for a visit in 2 weeks.   Contact information:   [address] [place] [address] [phone]          Signed:  Mearl Latin, PA-C [facility] [phone] (P) 04/25/2012, 12:17 PM

## 2012-04-25 NOTE — Evaluation (Signed)
Physical Therapy Evaluation Patient Details Name: Regina Rosales MRN: [id number] DOB: 1951/01/09 Today's Date: 04/25/2012 Time: 1120-1140 PT Time Calculation (min): 20 min  PT Assessment / Plan / Recommendation Clinical Impression  Pt is 61 y/o female admitted for s/p right LE ORIF with hardware removal.  Pt has met all PT goals and has no further PT needs.  PT will sign off.      PT Assessment  Patent does not need any further PT services                   Equipment Recommendations  None recommended by PT               Precautions / Restrictions Precautions Precautions: None Restrictions Weight Bearing Restrictions: Yes LLE Weight Bearing: Weight bearing as tolerated   Pertinent Vitals/Pain Mild c/o pain but does not rate      Mobility  Bed Mobility Bed Mobility: Supine to Sit Supine to Sit: 6: Modified independent (Device/Increase time);HOB flat Transfers Transfers: Sit to Stand;Stand to Sit Sit to Stand: 6: Modified independent (Device/Increase time);With upper extremity assist;From bed Stand to Sit: 5: Supervision;With upper extremity assist;To chair/3-in-1 Details for Transfer Assistance: vc to remind pt of proper hand placement Ambulation/Gait Ambulation/Gait Assistance: 6: Modified independent (Device/Increase time) Ambulation Distance (Feet): 125 Feet Assistive device: Rolling walker Stairs: Yes Stairs Assistance: 4: Min guard Stair Management Technique: One rail Left;Forwards;With cane;Step to pattern Number of Stairs: 3     Exercises     PT Diagnosis:    PT Problem List:   PT Treatment Interventions:     PT Goals    Visit Information  Last PT Received On: 04/25/12    Subjective Data  Subjective: "I hope you pass me so I can go home." Patient Stated Goal: To go home today.   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 Communication Communication: No difficulties Dominant Hand: Right    Cognition  Overall Cognitive Status: Appears within functional limits for tasks assessed/performed Arousal/Alertness: Awake/alert Orientation Level: Appears intact for tasks assessed Behavior During Session: [facility] for tasks performed    Extremity/Trunk Assessment Right Upper Extremity Assessment RUE ROM/Strength/Tone: Within functional levels RUE Sensation: WFL - Light Touch;WFL - Proprioception RUE Coordination: WFL - gross/fine motor Left Upper Extremity Assessment LUE ROM/Strength/Tone: Deficits LUE ROM/Strength/Tone Deficits: L frozen shoulder LUE Sensation: WFL - Light Touch;WFL - Proprioception LUE Coordination: WFL - gross/fine motor Right Lower Extremity Assessment RLE ROM/Strength/Tone: Deficits Left Lower Extremity Assessment LLE ROM/Strength/Tone: WFL for tasks assessed LLE Sensation: WFL - Proprioception LLE Coordination: WFL - gross/fine motor Trunk Assessment Trunk Assessment: Normal   Balance Balance Balance Assessed:  (WFL for ADL)  End of Session PT - End of Session Equipment Utilized During Treatment: Gait belt Activity Tolerance: Patient tolerated treatment well Patient left: in chair;with call bell/phone within reach Nurse Communication: Mobility status;Weight bearing status  GP     Jaleya Pebley 04/25/2012, 1:18 PM Jake Shark, PT DPT [phone]

## 2012-04-25 NOTE — Progress Notes (Signed)
[facility]edic [facility]uma Service (OTS)  Subjective: 1 Day Post-Op Procedure(s) (LRB): HARDWARE REMOVAL (Right)   Doing well Pain controlled Ready to go home No CP, No SOB Notes some muscle spasms  Objective: Current Vitals Blood pressure 135/66, pulse 84, temperature 98.9 F (37.2 C), temperature source Oral, resp. rate 18, SpO2 100.00%. Vital signs in last 24 hours: Temp:  [97 F (36.1 C)-99 F (37.2 C)] 98.9 F (37.2 C) (09/10 0712) Pulse Rate:  [43-90] 84  (09/10 0712) Resp:  [11-22] 18  (09/10 0712) BP: (104-135)/(49-98) 135/66 mmHg (09/10 0712) SpO2:  [98 %-100 %] 100 % (09/10 0712)  Intake/Output from previous day: 09/09 0701 - 09/10 0700 In: 1770 [P.O.:720; I.V.:1050] Out: 50 [Blood:50]  LABS No results found for this basename: HGB:5 in the last 72 hours No results found for this basename: WBC:2,RBC:2,HCT:2,PLT:2 in the last 72 hours No results found for this basename: NA:2,K:2,CL:2,CO2:2,BUN:2,CREATININE:2,GLUCOSE:2,CALCIUM:2 in the last 72 hours No results found for this basename: LABPT:2,INR:2 in the last 72 hours   Physical Exam  Gen:NAD, resting comfortably in bedside chair  Lungs:clear Cardiac:reg Abd:NT Ext:     Right Leg  Dressings clean, dry and intact  Ext is warm  Distal motor and sensory functions intact  Swelling stable    Imaging Dg Femur Right  04/24/2012  *RADIOLOGY REPORT*  Clinical Data: Symptomatic hardware distal femur and tibia.  DG C-ARM 61-120 MIN,RIGHT FEMUR - 2 VIEW  Comparison: 07/30/2011  Findings: C-arm films document hardware removal from the distal femur and proximal tibia. Healed  distal femur fracture and tibial plateau fracture.  No adverse features.  IMPRESSION: As above.   Original Report Authenticated By: Elsie Stain, M.D.    Dg Chest Portable 1 View  04/24/2012  *RADIOLOGY REPORT*  Clinical Data: Femur fracture. Pre-op respiratory exam.  PORTABLE CHEST - 1 VIEW  Comparison: 04/13/2012  Findings: Low lung volumes are  seen.  There is mild atelectasis seen in the left lower lung which is new since previous study.  No evidence of pulmonary consolidation or edema.  No evidence of pleural effusion.  Heart size and mediastinal contours are within normal limits.  IMPRESSION: Low lung volumes with mild left lower lobe subsegmental atelectasis.   Original Report Authenticated By: Danae Orleans, M.D.    Dg C-arm [phone] Min  04/24/2012  *RADIOLOGY REPORT*  Clinical Data: Symptomatic hardware distal femur and tibia.  DG C-ARM 61-120 MIN,RIGHT FEMUR - 2 VIEW  Comparison: 07/30/2011  Findings: C-arm films document hardware removal from the distal femur and proximal tibia. Healed  distal femur fracture and tibial plateau fracture.  No adverse features.  IMPRESSION: As above.   Original Report Authenticated By: Elsie Stain, M.D.     Assessment/Plan: 1 Day Post-Op Procedure(s) (LRB): HARDWARE REMOVAL (Right)  61 y/o female s/p HW removal R leg  1. ROH R leg  ROM as tolerated all joints  WBAT  Ice prn  Compressive wraps for swelling  Dressing change in 2-3 days  Follow up in 14 days 2. DVT/PE prophylaxis  ASA daily x 4 weeks 3. Pain  Po meds 4. FEN  Diet as tolerated  5. Dispo  D/c home today  Mearl Latin, PA-C [facility] [phone] (P) 04/25/2012, 12:11 PM
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
## 2014-10-21 DIAGNOSIS — K2970 Gastritis, unspecified, without bleeding: Secondary | ICD-10-CM | POA: Diagnosis not present

## 2014-10-26 DIAGNOSIS — R197 Diarrhea, unspecified: Secondary | ICD-10-CM | POA: Diagnosis not present

## 2014-12-17 DIAGNOSIS — K529 Noninfective gastroenteritis and colitis, unspecified: Secondary | ICD-10-CM | POA: Diagnosis not present

## 2014-12-17 DIAGNOSIS — Z8371 Family history of colonic polyps: Secondary | ICD-10-CM | POA: Diagnosis not present

## 2014-12-31 DIAGNOSIS — D120 Benign neoplasm of cecum: Secondary | ICD-10-CM | POA: Diagnosis not present

## 2014-12-31 DIAGNOSIS — R197 Diarrhea, unspecified: Secondary | ICD-10-CM | POA: Diagnosis not present

## 2014-12-31 DIAGNOSIS — Z8371 Family history of colonic polyps: Secondary | ICD-10-CM | POA: Diagnosis not present

## 2014-12-31 DIAGNOSIS — E669 Obesity, unspecified: Secondary | ICD-10-CM | POA: Diagnosis not present

## 2014-12-31 DIAGNOSIS — Z6832 Body mass index (BMI) 32.0-32.9, adult: Secondary | ICD-10-CM | POA: Diagnosis not present

## 2014-12-31 DIAGNOSIS — Z87891 Personal history of nicotine dependence: Secondary | ICD-10-CM | POA: Diagnosis not present

## 2014-12-31 DIAGNOSIS — K529 Noninfective gastroenteritis and colitis, unspecified: Secondary | ICD-10-CM | POA: Diagnosis not present

## 2014-12-31 DIAGNOSIS — K5730 Diverticulosis of large intestine without perforation or abscess without bleeding: Secondary | ICD-10-CM | POA: Diagnosis not present

## 2015-01-02 DIAGNOSIS — D120 Benign neoplasm of cecum: Secondary | ICD-10-CM | POA: Diagnosis not present

## 2015-01-22 DIAGNOSIS — K625 Hemorrhage of anus and rectum: Secondary | ICD-10-CM | POA: Diagnosis not present

## 2015-05-30 DIAGNOSIS — Z23 Encounter for immunization: Secondary | ICD-10-CM | POA: Diagnosis not present

## 2015-06-05 DIAGNOSIS — N3080 Other cystitis without hematuria: Secondary | ICD-10-CM | POA: Diagnosis not present

## 2015-06-05 DIAGNOSIS — M549 Dorsalgia, unspecified: Secondary | ICD-10-CM | POA: Diagnosis not present

## 2015-06-05 DIAGNOSIS — M1731 Unilateral post-traumatic osteoarthritis, right knee: Secondary | ICD-10-CM | POA: Diagnosis not present

## 2015-06-05 DIAGNOSIS — I10 Essential (primary) hypertension: Secondary | ICD-10-CM | POA: Diagnosis not present

## 2015-06-05 DIAGNOSIS — Z131 Encounter for screening for diabetes mellitus: Secondary | ICD-10-CM | POA: Diagnosis not present

## 2015-06-05 DIAGNOSIS — R109 Unspecified abdominal pain: Secondary | ICD-10-CM | POA: Diagnosis not present

## 2015-06-05 DIAGNOSIS — R350 Frequency of micturition: Secondary | ICD-10-CM | POA: Diagnosis not present

## 2015-08-29 DIAGNOSIS — Z0000 Encounter for general adult medical examination without abnormal findings: Secondary | ICD-10-CM | POA: Diagnosis not present

## 2015-08-29 DIAGNOSIS — N3080 Other cystitis without hematuria: Secondary | ICD-10-CM | POA: Diagnosis not present

## 2015-08-29 DIAGNOSIS — I10 Essential (primary) hypertension: Secondary | ICD-10-CM | POA: Diagnosis not present

## 2015-09-04 DIAGNOSIS — K8020 Calculus of gallbladder without cholecystitis without obstruction: Secondary | ICD-10-CM | POA: Diagnosis not present

## 2015-09-04 DIAGNOSIS — R310 Gross hematuria: Secondary | ICD-10-CM | POA: Diagnosis not present

## 2015-09-04 DIAGNOSIS — K5790 Diverticulosis of intestine, part unspecified, without perforation or abscess without bleeding: Secondary | ICD-10-CM | POA: Diagnosis not present

## 2015-09-15 DIAGNOSIS — K8020 Calculus of gallbladder without cholecystitis without obstruction: Secondary | ICD-10-CM | POA: Diagnosis not present

## 2015-09-15 DIAGNOSIS — R197 Diarrhea, unspecified: Secondary | ICD-10-CM | POA: Diagnosis not present

## 2015-09-18 DIAGNOSIS — Z1231 Encounter for screening mammogram for malignant neoplasm of breast: Secondary | ICD-10-CM | POA: Diagnosis not present

## 2015-09-18 DIAGNOSIS — R921 Mammographic calcification found on diagnostic imaging of breast: Secondary | ICD-10-CM | POA: Diagnosis not present

## 2015-09-24 DIAGNOSIS — M810 Age-related osteoporosis without current pathological fracture: Secondary | ICD-10-CM | POA: Diagnosis not present

## 2015-09-24 DIAGNOSIS — M8589 Other specified disorders of bone density and structure, multiple sites: Secondary | ICD-10-CM | POA: Diagnosis not present

## 2015-10-01 ENCOUNTER — Other Ambulatory Visit: Payer: Self-pay | Admitting: Internal Medicine

## 2015-10-01 DIAGNOSIS — R109 Unspecified abdominal pain: Secondary | ICD-10-CM | POA: Diagnosis not present

## 2015-10-01 DIAGNOSIS — R921 Mammographic calcification found on diagnostic imaging of breast: Secondary | ICD-10-CM | POA: Diagnosis not present

## 2015-10-01 DIAGNOSIS — K8020 Calculus of gallbladder without cholecystitis without obstruction: Secondary | ICD-10-CM | POA: Diagnosis not present

## 2015-10-09 DIAGNOSIS — R140 Abdominal distension (gaseous): Secondary | ICD-10-CM | POA: Diagnosis not present

## 2015-10-09 DIAGNOSIS — R109 Unspecified abdominal pain: Secondary | ICD-10-CM | POA: Diagnosis not present

## 2015-10-10 DIAGNOSIS — M5431 Sciatica, right side: Secondary | ICD-10-CM | POA: Diagnosis not present

## 2015-10-13 DIAGNOSIS — R197 Diarrhea, unspecified: Secondary | ICD-10-CM | POA: Diagnosis not present

## 2015-12-08 ENCOUNTER — Ambulatory Visit
Admission: RE | Admit: 2015-12-08 | Discharge: 2015-12-08 | Disposition: A | Discharge status: Home / Self Care | Payer: Medicare Other | Source: Ambulatory Visit | Attending: Internal Medicine | Admitting: Internal Medicine

## 2015-12-08 DIAGNOSIS — R921 Mammographic calcification found on diagnostic imaging of breast: Secondary | ICD-10-CM

## 2015-12-08 DIAGNOSIS — N6011 Diffuse cystic mastopathy of right breast: Secondary | ICD-10-CM | POA: Diagnosis not present

## 2015-12-30 DIAGNOSIS — A681 Tick-borne relapsing fever: Secondary | ICD-10-CM | POA: Diagnosis not present

## 2016-04-26 DIAGNOSIS — Z23 Encounter for immunization: Secondary | ICD-10-CM | POA: Diagnosis not present

## 2016-06-02 DIAGNOSIS — Z23 Encounter for immunization: Secondary | ICD-10-CM | POA: Diagnosis not present

## 2016-10-08 DIAGNOSIS — Z0000 Encounter for general adult medical examination without abnormal findings: Secondary | ICD-10-CM | POA: Diagnosis not present

## 2016-10-08 DIAGNOSIS — K580 Irritable bowel syndrome with diarrhea: Secondary | ICD-10-CM | POA: Diagnosis not present

## 2017-01-07 DIAGNOSIS — K580 Irritable bowel syndrome with diarrhea: Secondary | ICD-10-CM | POA: Diagnosis not present

## 2017-03-04 DIAGNOSIS — M25532 Pain in left wrist: Secondary | ICD-10-CM | POA: Diagnosis not present

## 2017-04-08 DIAGNOSIS — K580 Irritable bowel syndrome with diarrhea: Secondary | ICD-10-CM | POA: Diagnosis not present

## 2017-04-08 DIAGNOSIS — I10 Essential (primary) hypertension: Secondary | ICD-10-CM | POA: Diagnosis not present

## 2017-04-20 DIAGNOSIS — Z23 Encounter for immunization: Secondary | ICD-10-CM | POA: Diagnosis not present

## 2017-06-09 DIAGNOSIS — K580 Irritable bowel syndrome with diarrhea: Secondary | ICD-10-CM | POA: Diagnosis not present

## 2017-06-09 DIAGNOSIS — I10 Essential (primary) hypertension: Secondary | ICD-10-CM | POA: Diagnosis not present

## 2018-01-26 DIAGNOSIS — H43812 Vitreous degeneration, left eye: Secondary | ICD-10-CM | POA: Diagnosis not present

## 2019-01-09 DIAGNOSIS — M531 Cervicobrachial syndrome: Secondary | ICD-10-CM | POA: Diagnosis not present

## 2019-01-09 DIAGNOSIS — M47812 Spondylosis without myelopathy or radiculopathy, cervical region: Secondary | ICD-10-CM | POA: Diagnosis not present

## 2019-01-09 DIAGNOSIS — M9901 Segmental and somatic dysfunction of cervical region: Secondary | ICD-10-CM | POA: Diagnosis not present

## 2019-01-11 DIAGNOSIS — M47812 Spondylosis without myelopathy or radiculopathy, cervical region: Secondary | ICD-10-CM | POA: Diagnosis not present

## 2019-01-11 DIAGNOSIS — M531 Cervicobrachial syndrome: Secondary | ICD-10-CM | POA: Diagnosis not present

## 2019-01-11 DIAGNOSIS — M9901 Segmental and somatic dysfunction of cervical region: Secondary | ICD-10-CM | POA: Diagnosis not present

## 2019-01-15 DIAGNOSIS — M47812 Spondylosis without myelopathy or radiculopathy, cervical region: Secondary | ICD-10-CM | POA: Diagnosis not present

## 2019-01-15 DIAGNOSIS — M531 Cervicobrachial syndrome: Secondary | ICD-10-CM | POA: Diagnosis not present

## 2019-01-15 DIAGNOSIS — M9901 Segmental and somatic dysfunction of cervical region: Secondary | ICD-10-CM | POA: Diagnosis not present

## 2019-01-18 DIAGNOSIS — M531 Cervicobrachial syndrome: Secondary | ICD-10-CM | POA: Diagnosis not present

## 2019-01-18 DIAGNOSIS — M9901 Segmental and somatic dysfunction of cervical region: Secondary | ICD-10-CM | POA: Diagnosis not present

## 2019-01-18 DIAGNOSIS — M47812 Spondylosis without myelopathy or radiculopathy, cervical region: Secondary | ICD-10-CM | POA: Diagnosis not present

## 2019-01-22 DIAGNOSIS — M531 Cervicobrachial syndrome: Secondary | ICD-10-CM | POA: Diagnosis not present

## 2019-01-22 DIAGNOSIS — M9901 Segmental and somatic dysfunction of cervical region: Secondary | ICD-10-CM | POA: Diagnosis not present

## 2019-01-22 DIAGNOSIS — M47812 Spondylosis without myelopathy or radiculopathy, cervical region: Secondary | ICD-10-CM | POA: Diagnosis not present

## 2019-01-25 DIAGNOSIS — M9901 Segmental and somatic dysfunction of cervical region: Secondary | ICD-10-CM | POA: Diagnosis not present

## 2019-01-25 DIAGNOSIS — M47812 Spondylosis without myelopathy or radiculopathy, cervical region: Secondary | ICD-10-CM | POA: Diagnosis not present

## 2019-01-25 DIAGNOSIS — M531 Cervicobrachial syndrome: Secondary | ICD-10-CM | POA: Diagnosis not present

## 2019-01-30 DIAGNOSIS — M47812 Spondylosis without myelopathy or radiculopathy, cervical region: Secondary | ICD-10-CM | POA: Diagnosis not present

## 2019-01-30 DIAGNOSIS — M9901 Segmental and somatic dysfunction of cervical region: Secondary | ICD-10-CM | POA: Diagnosis not present

## 2019-01-30 DIAGNOSIS — M531 Cervicobrachial syndrome: Secondary | ICD-10-CM | POA: Diagnosis not present

## 2019-02-05 DIAGNOSIS — M531 Cervicobrachial syndrome: Secondary | ICD-10-CM | POA: Diagnosis not present

## 2019-02-05 DIAGNOSIS — M9901 Segmental and somatic dysfunction of cervical region: Secondary | ICD-10-CM | POA: Diagnosis not present

## 2019-02-05 DIAGNOSIS — M47812 Spondylosis without myelopathy or radiculopathy, cervical region: Secondary | ICD-10-CM | POA: Diagnosis not present

## 2019-02-12 DIAGNOSIS — M531 Cervicobrachial syndrome: Secondary | ICD-10-CM | POA: Diagnosis not present

## 2019-02-12 DIAGNOSIS — M9901 Segmental and somatic dysfunction of cervical region: Secondary | ICD-10-CM | POA: Diagnosis not present

## 2019-02-12 DIAGNOSIS — M47812 Spondylosis without myelopathy or radiculopathy, cervical region: Secondary | ICD-10-CM | POA: Diagnosis not present

## 2019-02-19 DIAGNOSIS — M531 Cervicobrachial syndrome: Secondary | ICD-10-CM | POA: Diagnosis not present

## 2019-02-19 DIAGNOSIS — M9901 Segmental and somatic dysfunction of cervical region: Secondary | ICD-10-CM | POA: Diagnosis not present

## 2019-02-19 DIAGNOSIS — M47812 Spondylosis without myelopathy or radiculopathy, cervical region: Secondary | ICD-10-CM | POA: Diagnosis not present

## 2019-02-26 DIAGNOSIS — M9901 Segmental and somatic dysfunction of cervical region: Secondary | ICD-10-CM | POA: Diagnosis not present

## 2019-02-26 DIAGNOSIS — M531 Cervicobrachial syndrome: Secondary | ICD-10-CM | POA: Diagnosis not present

## 2019-02-26 DIAGNOSIS — M47812 Spondylosis without myelopathy or radiculopathy, cervical region: Secondary | ICD-10-CM | POA: Diagnosis not present

## 2019-03-07 DIAGNOSIS — K580 Irritable bowel syndrome with diarrhea: Secondary | ICD-10-CM | POA: Diagnosis not present

## 2019-03-07 DIAGNOSIS — K529 Noninfective gastroenteritis and colitis, unspecified: Secondary | ICD-10-CM | POA: Diagnosis not present

## 2019-03-07 DIAGNOSIS — I10 Essential (primary) hypertension: Secondary | ICD-10-CM | POA: Diagnosis not present

## 2019-03-16 ENCOUNTER — Other Ambulatory Visit: Payer: Self-pay

## 2019-11-26 DIAGNOSIS — Z23 Encounter for immunization: Secondary | ICD-10-CM | POA: Diagnosis not present

## 2019-12-27 DIAGNOSIS — Z6832 Body mass index (BMI) 32.0-32.9, adult: Secondary | ICD-10-CM | POA: Diagnosis not present

## 2019-12-27 DIAGNOSIS — M19011 Primary osteoarthritis, right shoulder: Secondary | ICD-10-CM | POA: Diagnosis not present

## 2019-12-27 DIAGNOSIS — M7711 Lateral epicondylitis, right elbow: Secondary | ICD-10-CM | POA: Diagnosis not present

## 2019-12-27 DIAGNOSIS — M19112 Post-traumatic osteoarthritis, left shoulder: Secondary | ICD-10-CM | POA: Diagnosis not present

## 2020-01-23 DIAGNOSIS — A681 Tick-borne relapsing fever: Secondary | ICD-10-CM | POA: Diagnosis not present

## 2020-01-23 DIAGNOSIS — I10 Essential (primary) hypertension: Secondary | ICD-10-CM | POA: Diagnosis not present

## 2020-01-31 DIAGNOSIS — Z6832 Body mass index (BMI) 32.0-32.9, adult: Secondary | ICD-10-CM | POA: Diagnosis not present

## 2020-01-31 DIAGNOSIS — M19112 Post-traumatic osteoarthritis, left shoulder: Secondary | ICD-10-CM | POA: Diagnosis not present

## 2020-01-31 DIAGNOSIS — M19011 Primary osteoarthritis, right shoulder: Secondary | ICD-10-CM | POA: Diagnosis not present

## 2020-05-15 DIAGNOSIS — Z20822 Contact with and (suspected) exposure to covid-19: Secondary | ICD-10-CM | POA: Diagnosis not present

## 2020-05-22 DIAGNOSIS — J4520 Mild intermittent asthma, uncomplicated: Secondary | ICD-10-CM | POA: Diagnosis not present

## 2020-05-22 DIAGNOSIS — I10 Essential (primary) hypertension: Secondary | ICD-10-CM | POA: Diagnosis not present

## 2020-05-22 DIAGNOSIS — K580 Irritable bowel syndrome with diarrhea: Secondary | ICD-10-CM | POA: Diagnosis not present

## 2020-08-22 DIAGNOSIS — I10 Essential (primary) hypertension: Secondary | ICD-10-CM | POA: Diagnosis not present

## 2020-08-22 DIAGNOSIS — J4520 Mild intermittent asthma, uncomplicated: Secondary | ICD-10-CM | POA: Diagnosis not present

## 2020-08-22 DIAGNOSIS — K580 Irritable bowel syndrome with diarrhea: Secondary | ICD-10-CM | POA: Diagnosis not present

## 2020-11-20 DIAGNOSIS — Z20822 Contact with and (suspected) exposure to covid-19: Secondary | ICD-10-CM | POA: Diagnosis not present

## 2020-11-25 DIAGNOSIS — I10 Essential (primary) hypertension: Secondary | ICD-10-CM | POA: Diagnosis not present

## 2020-11-25 DIAGNOSIS — K580 Irritable bowel syndrome with diarrhea: Secondary | ICD-10-CM | POA: Diagnosis not present

## 2020-11-25 DIAGNOSIS — J4520 Mild intermittent asthma, uncomplicated: Secondary | ICD-10-CM | POA: Diagnosis not present

## 2020-11-27 ENCOUNTER — Encounter (INDEPENDENT_AMBULATORY_CARE_PROVIDER_SITE_OTHER): Payer: Self-pay | Admitting: *Deleted

## 2020-12-10 DIAGNOSIS — J0191 Acute recurrent sinusitis, unspecified: Secondary | ICD-10-CM | POA: Diagnosis not present

## 2020-12-10 DIAGNOSIS — N3942 Incontinence without sensory awareness: Secondary | ICD-10-CM | POA: Diagnosis not present

## 2021-01-13 DIAGNOSIS — Z79899 Other long term (current) drug therapy: Secondary | ICD-10-CM | POA: Diagnosis not present

## 2021-01-13 DIAGNOSIS — G894 Chronic pain syndrome: Secondary | ICD-10-CM | POA: Diagnosis not present

## 2021-01-13 DIAGNOSIS — M79659 Pain in unspecified thigh: Secondary | ICD-10-CM | POA: Diagnosis not present

## 2021-01-13 DIAGNOSIS — Z79891 Long term (current) use of opiate analgesic: Secondary | ICD-10-CM | POA: Diagnosis not present

## 2021-01-13 DIAGNOSIS — M79669 Pain in unspecified lower leg: Secondary | ICD-10-CM | POA: Diagnosis not present

## 2021-02-10 DIAGNOSIS — Z79891 Long term (current) use of opiate analgesic: Secondary | ICD-10-CM | POA: Diagnosis not present

## 2021-02-10 DIAGNOSIS — G894 Chronic pain syndrome: Secondary | ICD-10-CM | POA: Diagnosis not present

## 2021-02-10 DIAGNOSIS — M79669 Pain in unspecified lower leg: Secondary | ICD-10-CM | POA: Diagnosis not present

## 2021-02-10 DIAGNOSIS — M79659 Pain in unspecified thigh: Secondary | ICD-10-CM | POA: Diagnosis not present

## 2021-02-20 DIAGNOSIS — H35371 Puckering of macula, right eye: Secondary | ICD-10-CM | POA: Diagnosis not present

## 2021-02-25 DIAGNOSIS — Z79899 Other long term (current) drug therapy: Secondary | ICD-10-CM | POA: Diagnosis not present

## 2021-02-25 DIAGNOSIS — I10 Essential (primary) hypertension: Secondary | ICD-10-CM | POA: Diagnosis not present

## 2021-02-25 DIAGNOSIS — K580 Irritable bowel syndrome with diarrhea: Secondary | ICD-10-CM | POA: Diagnosis not present

## 2021-02-25 DIAGNOSIS — J4520 Mild intermittent asthma, uncomplicated: Secondary | ICD-10-CM | POA: Diagnosis not present

## 2021-03-10 DIAGNOSIS — G894 Chronic pain syndrome: Secondary | ICD-10-CM | POA: Diagnosis not present

## 2021-03-10 DIAGNOSIS — M79659 Pain in unspecified thigh: Secondary | ICD-10-CM | POA: Diagnosis not present

## 2021-03-10 DIAGNOSIS — Z79891 Long term (current) use of opiate analgesic: Secondary | ICD-10-CM | POA: Diagnosis not present

## 2021-03-10 DIAGNOSIS — M79669 Pain in unspecified lower leg: Secondary | ICD-10-CM | POA: Diagnosis not present

## 2021-03-26 ENCOUNTER — Ambulatory Visit (INDEPENDENT_AMBULATORY_CARE_PROVIDER_SITE_OTHER): Payer: Medicare Other | Admitting: Gastroenterology

## 2021-03-26 ENCOUNTER — Encounter (INDEPENDENT_AMBULATORY_CARE_PROVIDER_SITE_OTHER): Payer: Self-pay | Admitting: Gastroenterology

## 2021-03-26 ENCOUNTER — Other Ambulatory Visit: Payer: Self-pay

## 2021-03-26 DIAGNOSIS — R197 Diarrhea, unspecified: Secondary | ICD-10-CM | POA: Diagnosis not present

## 2021-03-26 DIAGNOSIS — K529 Noninfective gastroenteritis and colitis, unspecified: Secondary | ICD-10-CM | POA: Insufficient documentation
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
## 2021-03-26 NOTE — Patient Instructions (Addendum)
Schedule colonoscopy Perform blood workup Continue current diet Can take hyoscyamine if you have recurrent diarrhea even when following diet

## 2021-03-27 LAB — COMPREHENSIVE METABOLIC PANEL
AG Ratio: 1.5 (calc) (ref 1.0–2.5)
ALT: 12 U/L (ref 6–29)
AST: 19 U/L (ref 10–35)
Albumin: 3.9 g/dL (ref 3.6–5.1)
Alkaline phosphatase (APISO): 66 U/L (ref 37–153)
BUN: 13 mg/dL (ref 7–25)
CO2: 27 mmol/L (ref 20–32)
Calcium: 9.6 mg/dL (ref 8.6–10.4)
Chloride: 104 mmol/L (ref 98–110)
Creat: 0.94 mg/dL (ref 0.60–1.00)
Globulin: 2.6 g/dL (calc) (ref 1.9–3.7)
Glucose, Bld: 85 mg/dL (ref 65–99)
Potassium: 4.3 mmol/L (ref 3.5–5.3)
Sodium: 139 mmol/L (ref 135–146)
Total Bilirubin: 0.4 mg/dL (ref 0.2–1.2)
Total Protein: 6.5 g/dL (ref 6.1–8.1)

## 2021-03-27 LAB — CBC WITH DIFFERENTIAL/PLATELET
Absolute Monocytes: 521 cells/uL (ref 200–950)
Basophils Absolute: 53 cells/uL (ref 0–200)
Basophils Relative: 0.8 %
Eosinophils Absolute: 290 cells/uL (ref 15–500)
Eosinophils Relative: 4.4 %
HCT: 43.1 % (ref 35.0–45.0)
Hemoglobin: 13.8 g/dL (ref 11.7–15.5)
Lymphs Abs: 1841 cells/uL (ref 850–3900)
MCH: 30.1 pg (ref 27.0–33.0)
MCHC: 32 g/dL (ref 32.0–36.0)
MCV: 93.9 fL (ref 80.0–100.0)
MPV: 10 fL (ref 7.5–12.5)
Monocytes Relative: 7.9 %
Neutro Abs: 3894 cells/uL (ref 1500–7800)
Neutrophils Relative %: 59 %
Platelets: 262 10*3/uL (ref 140–400)
RBC: 4.59 10*6/uL (ref 3.80–5.10)
RDW: 12.1 % (ref 11.0–15.0)
Total Lymphocyte: 27.9 %
WBC: 6.6 10*3/uL (ref 3.8–10.8)

## 2021-03-27 LAB — CELIAC DISEASE PANEL
(tTG) Ab, IgA: <1 U/mL
(tTG) Ab, IgG: <1 U/mL
Gliadin IgA: <1 U/mL
Gliadin IgG: <1 U/mL
Immunoglobulin A: 295 mg/dL (ref 70–320)

## 2021-03-27 LAB — TSH: TSH: 2.35 mIU/L (ref 0.40–4.50)

## 2021-03-30 DIAGNOSIS — R197 Diarrhea, unspecified: Secondary | ICD-10-CM | POA: Diagnosis not present

## 2021-03-30 DIAGNOSIS — K529 Noninfective gastroenteritis and colitis, unspecified: Secondary | ICD-10-CM | POA: Diagnosis not present

## 2021-03-31 ENCOUNTER — Other Ambulatory Visit (INDEPENDENT_AMBULATORY_CARE_PROVIDER_SITE_OTHER): Payer: Self-pay

## 2021-03-31 ENCOUNTER — Encounter (INDEPENDENT_AMBULATORY_CARE_PROVIDER_SITE_OTHER): Payer: Self-pay

## 2021-04-04 LAB — FECAL FAT, QUALITATIVE: FECAL FAT, QUALITATIVE: NORMAL

## 2021-04-04 LAB — PANCREATIC ELASTASE, FECAL: Pancreatic Elastase-1, Stool: >500 mcg/g

## 2021-04-07 DIAGNOSIS — G894 Chronic pain syndrome: Secondary | ICD-10-CM | POA: Diagnosis not present

## 2021-04-07 DIAGNOSIS — M79659 Pain in unspecified thigh: Secondary | ICD-10-CM | POA: Diagnosis not present

## 2021-04-07 DIAGNOSIS — Z79899 Other long term (current) drug therapy: Secondary | ICD-10-CM | POA: Diagnosis not present

## 2021-04-07 DIAGNOSIS — Z79891 Long term (current) use of opiate analgesic: Secondary | ICD-10-CM | POA: Diagnosis not present

## 2021-04-07 DIAGNOSIS — M79669 Pain in unspecified lower leg: Secondary | ICD-10-CM | POA: Diagnosis not present

## 2021-04-13 ENCOUNTER — Telehealth (INDEPENDENT_AMBULATORY_CARE_PROVIDER_SITE_OTHER): Payer: Self-pay
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
## 2021-04-13 NOTE — Telephone Encounter (Signed)
thanks

## 2021-04-13 NOTE — Telephone Encounter (Signed)
Yes, she needs to take the bowel prep even if ahving diarrhea. Can take Tylenol for the leg pain for now Thanks

## 2021-04-15 ENCOUNTER — Other Ambulatory Visit: Payer: Self-pay

## 2021-04-15 ENCOUNTER — Ambulatory Visit (HOSPITAL_COMMUNITY): Payer: Medicare Other | Admitting: Certified Registered"

## 2021-04-15 ENCOUNTER — Encounter (HOSPITAL_COMMUNITY)
Admission: RE | Disposition: A | Discharge status: Home / Self Care | Payer: Self-pay | Source: Home / Self Care | Attending: Gastroenterology

## 2021-04-15 ENCOUNTER — Encounter (HOSPITAL_COMMUNITY): Payer: Self-pay | Admitting: Gastroenterology

## 2021-04-15 ENCOUNTER — Ambulatory Visit (HOSPITAL_COMMUNITY)
Admission: RE | Admit: 2021-04-15 | Discharge: 2021-04-15 | Disposition: A | Discharge status: Home / Self Care | Payer: Medicare Other | Attending: Gastroenterology | Admitting: Gastroenterology

## 2021-04-15 DIAGNOSIS — Z7982 Long term (current) use of aspirin: Secondary | ICD-10-CM | POA: Insufficient documentation

## 2021-04-15 DIAGNOSIS — K648 Other hemorrhoids: Secondary | ICD-10-CM

## 2021-04-15 DIAGNOSIS — K6389 Other specified diseases of intestine: Secondary | ICD-10-CM | POA: Diagnosis not present

## 2021-04-15 DIAGNOSIS — K5730 Diverticulosis of large intestine without perforation or abscess without bleeding: Secondary | ICD-10-CM | POA: Diagnosis not present

## 2021-04-15 DIAGNOSIS — K529 Noninfective gastroenteritis and colitis, unspecified: Secondary | ICD-10-CM | POA: Diagnosis not present

## 2021-04-15 DIAGNOSIS — Z6835 Body mass index (BMI) 35.0-35.9, adult: Secondary | ICD-10-CM | POA: Diagnosis not present

## 2021-04-15 DIAGNOSIS — E669 Obesity, unspecified: Secondary | ICD-10-CM | POA: Diagnosis not present

## 2021-04-15 DIAGNOSIS — R197 Diarrhea, unspecified: Secondary | ICD-10-CM | POA: Diagnosis not present

## 2021-04-15 DIAGNOSIS — K649 Unspecified hemorrhoids: Secondary | ICD-10-CM | POA: Diagnosis not present

## 2021-04-15 DIAGNOSIS — Z87891 Personal history of nicotine dependence: Secondary | ICD-10-CM | POA: Insufficient documentation

## 2021-04-15 DIAGNOSIS — Z79899 Other long term (current) drug therapy: Secondary | ICD-10-CM | POA: Diagnosis not present

## 2021-04-15 HISTORY — PX: BIOPSY: SHX5522

## 2021-04-15 HISTORY — PX: COLONOSCOPY WITH PROPOFOL: SHX5780

## 2021-04-15 SURGERY — COLONOSCOPY WITH PROPOFOL
Anesthesia: General

## 2021-04-15 MED ORDER — STERILE WATER FOR IRRIGATION IR SOLN
Status: DC | PRN
  Administered 2021-04-15: 100 mL

## 2021-04-15 MED ORDER — EPHEDRINE SULFATE-NACL 50-0.9 MG/10ML-% IV SOSY
PREFILLED_SYRINGE | INTRAVENOUS | Status: DC | PRN
  Administered 2021-04-15 (×2): 5 mg via INTRAVENOUS

## 2021-04-15 MED ORDER — PROPOFOL 500 MG/50ML IV EMUL
INTRAVENOUS | Status: DC | PRN
  Administered 2021-04-15: 150 ug/kg/min via INTRAVENOUS

## 2021-04-15 MED ORDER — LACTATED RINGERS IV SOLN: INTRAVENOUS | Status: DC | PRN

## 2021-04-15 MED ORDER — LIDOCAINE HCL (CARDIAC) PF 100 MG/5ML IV SOSY
PREFILLED_SYRINGE | INTRAVENOUS | Status: DC | PRN
  Administered 2021-04-15: 50 mg via INTRAVENOUS

## 2021-04-15 MED ORDER — PROPOFOL 10 MG/ML IV BOLUS
INTRAVENOUS | Status: DC | PRN
  Administered 2021-04-15: 100 mg via INTRAVENOUS

## 2021-04-15 NOTE — Op Note (Signed)
[facility] Patient Name: Regina Rosales Procedure Date: 04/15/2021 10:36 AM MRN: [id number] Date of Birth: 1950-10-23 Attending MD: Maylon Peppers ,  CSN: [id number] Age: 70 Admit Type: Outpatient Procedure:                Colonoscopy Indications:              Chronic diarrhea Providers:                Maylon Peppers, Caprice Kluver, Kristine L. Risa Grill, Technician Referring MD:              Medicines:                Monitored Anesthesia Care Complications:            No immediate complications. Estimated Blood Loss:     Estimated blood loss: none. Procedure:                Pre-Anesthesia Assessment:                           - Prior to the procedure, a History and Physical                            was performed, and patient medications, allergies                            and sensitivities were reviewed. The patient's                            tolerance of previous anesthesia was reviewed.                           - The risks and benefits of the procedure and the                            sedation options and risks were discussed with the                            patient. All questions were answered and informed                            consent was obtained.                           - ASA Grade Assessment: II - A patient with mild                            systemic disease.                           After obtaining informed consent, the colonoscope                            was passed under direct vision. Throughout the  procedure, the patient's blood pressure, pulse, and                            oxygen saturations were monitored continuously. The                            [id number]) scope was introduced through                            the anus and advanced to the the cecum, identified                            by appendiceal orifice and ileocecal valve. The                             colonoscopy was performed without difficulty. The                            patient tolerated the procedure well. Scope In: 11:06:27 AM Scope Out: 11:33:02 AM Scope Withdrawal Time: 0 hours 14 minutes 34 seconds  Total Procedure Duration: 0 hours 26 minutes 35 seconds  Findings:      The perianal and digital rectal examinations were normal.      A few small and large-mouthed diverticula were found in the sigmoid       colon.      An area of significantly congested mucosa was found in the sigmoid       colon. This was difficult to pass but I was able to advance it with       torquing the scope. Biopsies for histology were taken with a cold       forceps from this site but also from the right colon and left colon for       evaluation of microscopic colitis.      Non-bleeding internal hemorrhoids were found during retroflexion. The       hemorrhoids were medium-sized. Impression:               - Diverticulosis in the sigmoid colon.                           - Congested mucosa in the sigmoid colon. Biopsied.                           - Non-bleeding internal hemorrhoids. Moderate Sedation:      Per Anesthesia Care Recommendation:           - Discharge patient to home (ambulatory).                           - Resume previous diet.                           - Await pathology results.                           - Repeat colonoscopy is not recommended due to  current age (62 years or older) for screening                            purposes. Procedure Code(s):        --- Professional ---                           [phone], Colonoscopy, flexible; with biopsy, single                            or multiple Diagnosis Code(s):        --- Professional ---                           K64.8, Other hemorrhoids                           K63.89, Other specified diseases of intestine                           K52.9, Noninfective gastroenteritis and colitis,                             unspecified                           K57.30, Diverticulosis of large intestine without                            perforation or abscess without bleeding CPT copyright 2019 American Medical Association. All rights reserved. The codes documented in this report are preliminary and upon coder review may  be revised to meet current compliance requirements. Maylon Peppers, MD Maylon Peppers,  04/15/2021 11:39:34 AM This report has been signed electronically. Number of Addenda: 0

## 2021-04-15 NOTE — Transfer of Care (Signed)
Immediate Anesthesia Transfer of Care Note  Patient: Regina Rosales  Procedure(s) Performed: COLONOSCOPY WITH PROPOFOL BIOPSY  Patient Location: Short Stay  Anesthesia Type:General  Level of Consciousness: drowsy  Airway & Oxygen Therapy: Patient Spontanous Breathing  Post-op Assessment: Report given to RN and Post -op Vital signs reviewed and stable  Post vital signs: Reviewed and stable  Last Vitals:  Vitals Value Taken Time  BP    Temp    Pulse    Resp    SpO2      Last Pain:  Vitals:   04/15/21 1101  TempSrc:   PainSc: 0-No pain      Patients Stated Pain Goal: 8 ([id number])  Complications: No notable events documented.

## 2021-04-15 NOTE — Discharge Instructions (Addendum)
You are being discharged to home.  Resume your previous diet.  We are waiting for your pathology results.  Your physician has indicated that a repeat colonoscopy is not recommended due to your current age (62 years or older) for screening purposes.   PATIENT INSTRUCTIONS POST-ANESTHESIA  IMMEDIATELY FOLLOWING SURGERY:  Do not drive or operate machinery for the first twenty four hours after surgery.  Do not make any important decisions for twenty four hours after surgery or while taking narcotic pain medications or sedatives.  If you develop intractable nausea and vomiting or a severe headache please notify your doctor immediately.  FOLLOW-UP:  Please make an appointment with your surgeon as instructed. You do not need to follow up with anesthesia unless specifically instructed to do so.  WOUND CARE INSTRUCTIONS (if applicable):  Keep a dry clean dressing on the anesthesia/puncture wound site if there is drainage.  Once the wound has quit draining you may leave it open to air.  Generally you should leave the bandage intact for twenty four hours unless there is drainage.  If the epidural site drains for more than 36-48 hours please call the anesthesia department.  QUESTIONS?:  Please feel free to call your physician or the hospital operator if you have any questions, and they will be happy to assist you.

## 2021-04-15 NOTE — Interval H&P Note (Signed)
History and Physical Interval Note:  04/15/2021 10:58 AM Regina Rosales is a 70 y.o. female with past medical history of degenerative disc disease of the cervical spine, obesity, who presents for evaluation of chronic diarrhea.  Patient reports she has presented some episodes of intermittent diarrhea when she eats food that is different from the diet she usually eats.  She denies any melena, hematochezia, abdominal pain or bloating.  BP 137/67   Pulse 73   Temp 99 F (37.2 C) (Oral)   Resp 13   SpO2 98%   GENERAL: The patient is AO x3, in no acute distress. HEENT: Head is normocephalic and atraumatic. EOMI are intact. Mouth is well hydrated and without lesions. NECK: Supple. No masses LUNGS: Clear to auscultation. No presence of rhonchi/wheezing/rales. Adequate chest expansion HEART: RRR, normal s1 and s2. ABDOMEN: Soft, nontender, no guarding, no peritoneal signs, and nondistended. BS +. No masses. EXTREMITIES: Without any cyanosis, clubbing, rash, lesions or edema. NEUROLOGIC: AOx3, no focal motor deficit. SKIN: no jaundice, no rashes  [name]  has presented today for surgery, with the diagnosis of Chronic Diarrhea.  The various methods of treatment have been discussed with the patient and family. After consideration of risks, benefits and other options for treatment, the patient has consented to  Procedure(s) with comments: COLONOSCOPY WITH PROPOFOL (N/A) - 10:40 as a surgical intervention.  The patient's history has been reviewed, patient examined, no change in status, stable for surgery.  I have reviewed the patient's chart and labs.  Questions were answered to the patient's satisfaction.     Maylon Peppers [place]

## 2021-04-15 NOTE — Anesthesia Preprocedure Evaluation (Signed)
Anesthesia Evaluation  Patient identified by MRN, date of birth, ID band Patient awake    Reviewed: Allergy & Precautions, H&P , NPO status , Patient's Chart, lab work & pertinent test results, reviewed documented beta blocker date and time   Airway Mallampati: II  TM Distance: >3 FB Neck ROM: full    Dental no notable dental hx.    Pulmonary neg pulmonary ROS, former smoker,    Pulmonary exam normal breath sounds clear to auscultation       Cardiovascular Exercise Tolerance: Good negative cardio ROS   Rhythm:regular Rate:Normal     Neuro/Psych negative neurological ROS  negative psych ROS   GI/Hepatic negative GI ROS, Neg liver ROS,   Endo/Other  negative endocrine ROS  Renal/GU negative Renal ROS  negative genitourinary   Musculoskeletal   Abdominal   Peds  Hematology  (+) Blood dyscrasia, anemia ,   Anesthesia Other Findings   Reproductive/Obstetrics negative OB ROS                             Anesthesia Physical Anesthesia Plan  ASA: 2  Anesthesia Plan: General   Post-op Pain Management:    Induction:   PONV Risk Score and Plan: Propofol infusion  Airway Management Planned:   Additional Equipment:   Intra-op Plan:   Post-operative Plan:   Informed Consent: I have reviewed the patients History and Physical, chart, labs and discussed the procedure including the risks, benefits and alternatives for the proposed anesthesia with the patient or authorized representative who has indicated his/her understanding and acceptance.     Dental Advisory Given  Plan Discussed with: CRNA  Anesthesia Plan Comments:         Anesthesia Quick Evaluation

## 2021-04-15 NOTE — Anesthesia Procedure Notes (Signed)
Date/Time: 04/15/2021 11:08 AM Performed by: Orlie Dakin, CRNA Pre-anesthesia Checklist: Emergency Drugs available, Patient identified, Suction available and Patient being monitored Patient Re-evaluated:Patient Re-evaluated prior to induction Oxygen Delivery Method: Nasal cannula Induction Type: IV induction Placement Confirmation: positive ETCO2

## 2021-04-15 NOTE — Anesthesia Postprocedure Evaluation (Signed)
Anesthesia Post Note  Patient: Regina Rosales  Procedure(s) Performed: COLONOSCOPY WITH PROPOFOL BIOPSY  Patient location during evaluation: Phase II Anesthesia Type: General Level of consciousness: awake Pain management: pain level controlled Vital Signs Assessment: post-procedure vital signs reviewed and stable Respiratory status: spontaneous breathing and respiratory function stable Cardiovascular status: blood pressure returned to baseline and stable Postop Assessment: no headache and no apparent nausea or vomiting Anesthetic complications: no Comments: Late entry   No notable events documented.   Last Vitals:  Vitals:   04/15/21 0959 04/15/21 1137  BP: 137/67 (!) 98/47  Pulse: 73 80  Resp: 13 16  Temp: 37.2 C 36.6 C  SpO2: 98% 99%    Last Pain:  Vitals:   04/15/21 1137  TempSrc: Axillary  PainSc:                  Louann Sjogren

## 2021-04-16 LAB — SURGICAL PATHOLOGY

## 2021-04-18 DIAGNOSIS — H6502 Acute serous otitis media, left ear: Secondary | ICD-10-CM | POA: Diagnosis not present

## 2021-04-22 ENCOUNTER — Encounter (HOSPITAL_COMMUNITY): Payer: Self-pay | Admitting: Gastroenterology

## 2021-05-05 DIAGNOSIS — Z79891 Long term (current) use of opiate analgesic: Secondary | ICD-10-CM | POA: Diagnosis not present

## 2021-05-05 DIAGNOSIS — G894 Chronic pain syndrome: Secondary | ICD-10-CM | POA: Diagnosis not present

## 2021-05-05 DIAGNOSIS — M79669 Pain in unspecified lower leg: Secondary | ICD-10-CM | POA: Diagnosis not present

## 2021-05-05 DIAGNOSIS — M79659 Pain in unspecified thigh: Secondary | ICD-10-CM | POA: Diagnosis not present

## 2021-05-28 DIAGNOSIS — J4520 Mild intermittent asthma, uncomplicated: Secondary | ICD-10-CM | POA: Diagnosis not present

## 2021-05-28 DIAGNOSIS — K580 Irritable bowel syndrome with diarrhea: Secondary | ICD-10-CM | POA: Diagnosis not present

## 2021-05-28 DIAGNOSIS — I10 Essential (primary) hypertension: Secondary | ICD-10-CM | POA: Diagnosis not present

## 2021-07-15 DIAGNOSIS — J4520 Mild intermittent asthma, uncomplicated: Secondary | ICD-10-CM | POA: Diagnosis not present

## 2021-07-15 DIAGNOSIS — K580 Irritable bowel syndrome with diarrhea: Secondary | ICD-10-CM | POA: Diagnosis not present

## 2021-07-15 DIAGNOSIS — I10 Essential (primary) hypertension: Secondary | ICD-10-CM | POA: Diagnosis not present

## 2021-07-28 DIAGNOSIS — M79659 Pain in unspecified thigh: Secondary | ICD-10-CM | POA: Diagnosis not present

## 2021-07-28 DIAGNOSIS — G894 Chronic pain syndrome: Secondary | ICD-10-CM | POA: Diagnosis not present

## 2021-07-28 DIAGNOSIS — Z79891 Long term (current) use of opiate analgesic: Secondary | ICD-10-CM | POA: Diagnosis not present

## 2021-07-28 DIAGNOSIS — M79669 Pain in unspecified lower leg: Secondary | ICD-10-CM | POA: Diagnosis not present

## 2021-08-18 DIAGNOSIS — M779 Enthesopathy, unspecified: Secondary | ICD-10-CM | POA: Diagnosis not present

## 2021-08-18 DIAGNOSIS — L110 Acquired keratosis follicularis: Secondary | ICD-10-CM | POA: Diagnosis not present

## 2021-08-18 DIAGNOSIS — S93332A Other subluxation of left foot, initial encounter: Secondary | ICD-10-CM | POA: Diagnosis not present

## 2021-08-18 DIAGNOSIS — M79672 Pain in left foot: Secondary | ICD-10-CM | POA: Diagnosis not present

## 2021-08-18 DIAGNOSIS — M79675 Pain in left toe(s): Secondary | ICD-10-CM | POA: Diagnosis not present

## 2021-09-16 DIAGNOSIS — Z6835 Body mass index (BMI) 35.0-35.9, adult: Secondary | ICD-10-CM | POA: Diagnosis not present

## 2021-09-16 DIAGNOSIS — M25361 Other instability, right knee: Secondary | ICD-10-CM | POA: Diagnosis not present

## 2021-09-16 DIAGNOSIS — M1731 Unilateral post-traumatic osteoarthritis, right knee: Secondary | ICD-10-CM | POA: Diagnosis not present

## 2021-09-26 DIAGNOSIS — M5431 Sciatica, right side: Secondary | ICD-10-CM | POA: Diagnosis not present

## 2021-09-30 DIAGNOSIS — I10 Essential (primary) hypertension: Secondary | ICD-10-CM | POA: Diagnosis not present

## 2021-09-30 DIAGNOSIS — K580 Irritable bowel syndrome with diarrhea: Secondary | ICD-10-CM | POA: Diagnosis not present

## 2021-09-30 DIAGNOSIS — J4520 Mild intermittent asthma, uncomplicated: Secondary | ICD-10-CM | POA: Diagnosis not present

## 2021-09-30 DIAGNOSIS — J309 Allergic rhinitis, unspecified: Secondary | ICD-10-CM | POA: Diagnosis not present

## 2022-01-18 DIAGNOSIS — I10 Essential (primary) hypertension: Secondary | ICD-10-CM | POA: Diagnosis not present

## 2022-01-18 DIAGNOSIS — J309 Allergic rhinitis, unspecified: Secondary | ICD-10-CM | POA: Diagnosis not present

## 2022-01-18 DIAGNOSIS — J4520 Mild intermittent asthma, uncomplicated: Secondary | ICD-10-CM | POA: Diagnosis not present

## 2022-01-18 DIAGNOSIS — K580 Irritable bowel syndrome with diarrhea: Secondary | ICD-10-CM | POA: Diagnosis not present

## 2022-01-22 DIAGNOSIS — I10 Essential (primary) hypertension: Secondary | ICD-10-CM | POA: Diagnosis not present

## 2022-01-22 DIAGNOSIS — J309 Allergic rhinitis, unspecified: Secondary | ICD-10-CM | POA: Diagnosis not present

## 2022-01-22 DIAGNOSIS — J4520 Mild intermittent asthma, uncomplicated: Secondary | ICD-10-CM | POA: Diagnosis not present

## 2022-01-22 DIAGNOSIS — K580 Irritable bowel syndrome with diarrhea: Secondary | ICD-10-CM | POA: Diagnosis not present

## 2022-03-08 DIAGNOSIS — R109 Unspecified abdominal pain: Secondary | ICD-10-CM | POA: Diagnosis not present

## 2022-03-09 DIAGNOSIS — K8020 Calculus of gallbladder without cholecystitis without obstruction: Secondary | ICD-10-CM | POA: Diagnosis not present

## 2022-03-09 DIAGNOSIS — K838 Other specified diseases of biliary tract: Secondary | ICD-10-CM | POA: Diagnosis not present

## 2022-03-09 DIAGNOSIS — K7689 Other specified diseases of liver: Secondary | ICD-10-CM | POA: Diagnosis not present

## 2022-04-15 ENCOUNTER — Ambulatory Visit: Payer: Medicare Other | Admitting: Surgery

## 2022-05-17 DIAGNOSIS — I10 Essential (primary) hypertension: Secondary | ICD-10-CM | POA: Diagnosis not present

## 2022-05-17 DIAGNOSIS — N3942 Incontinence without sensory awareness: Secondary | ICD-10-CM | POA: Diagnosis not present

## 2022-05-17 DIAGNOSIS — K580 Irritable bowel syndrome with diarrhea: Secondary | ICD-10-CM | POA: Diagnosis not present

## 2022-08-13 DIAGNOSIS — H43812 Vitreous degeneration, left eye: Secondary | ICD-10-CM | POA: Diagnosis not present

## 2022-08-13 DIAGNOSIS — H524 Presbyopia: Secondary | ICD-10-CM | POA: Diagnosis not present

## 2022-09-06 DIAGNOSIS — I10 Essential (primary) hypertension: Secondary | ICD-10-CM | POA: Diagnosis not present

## 2022-09-06 DIAGNOSIS — N3942 Incontinence without sensory awareness: Secondary | ICD-10-CM | POA: Diagnosis not present

## 2022-09-06 DIAGNOSIS — K580 Irritable bowel syndrome with diarrhea: Secondary | ICD-10-CM | POA: Diagnosis not present

## 2022-09-06 DIAGNOSIS — M5431 Sciatica, right side: Secondary | ICD-10-CM | POA: Diagnosis not present

## 2022-09-22 DIAGNOSIS — S52592A Other fractures of lower end of left radius, initial encounter for closed fracture: Secondary | ICD-10-CM | POA: Diagnosis not present

## 2022-09-22 DIAGNOSIS — M25532 Pain in left wrist: Secondary | ICD-10-CM | POA: Diagnosis not present

## 2022-09-23 ENCOUNTER — Ambulatory Visit (INDEPENDENT_AMBULATORY_CARE_PROVIDER_SITE_OTHER): Payer: Medicare Other | Admitting: Orthopaedic Surgery

## 2022-09-23 ENCOUNTER — Ambulatory Visit: Payer: Medicare Other

## 2022-09-23 ENCOUNTER — Encounter: Payer: Self-pay | Admitting: Orthopaedic Surgery

## 2022-09-23 VITALS — Ht 61.0 in | Wt 179.0 lb

## 2022-09-23 DIAGNOSIS — S52502A Unspecified fracture of the lower end of left radius, initial encounter for closed fracture: Secondary | ICD-10-CM | POA: Insufficient documentation

## 2022-09-23 DIAGNOSIS — M25532 Pain in left wrist: Secondary | ICD-10-CM

## 2022-09-23 DIAGNOSIS — S52532A Colles' fracture of left radius, initial encounter for closed fracture: Secondary | ICD-10-CM

## 2022-09-23 MED ORDER — TRAMADOL HCL 50 MG PO TABS: 50.0000 mg | ORAL_TABLET | Freq: Four times a day (QID) | ORAL | 0 refills | Status: DC | PRN
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
## 2022-10-14 ENCOUNTER — Ambulatory Visit (INDEPENDENT_AMBULATORY_CARE_PROVIDER_SITE_OTHER): Payer: Medicare Other

## 2022-10-14 ENCOUNTER — Ambulatory Visit (INDEPENDENT_AMBULATORY_CARE_PROVIDER_SITE_OTHER): Payer: Medicare Other | Admitting: Orthopaedic Surgery

## 2022-10-14 ENCOUNTER — Encounter: Payer: Self-pay | Admitting: Orthopaedic Surgery

## 2022-10-14 VITALS — Ht 61.0 in | Wt 179.0 lb

## 2022-10-14 DIAGNOSIS — S52532A Colles' fracture of left radius, initial encounter for closed fracture: Secondary | ICD-10-CM

## 2022-10-14 MED ORDER — HYDROCODONE-ACETAMINOPHEN 5-325 MG PO TABS: 1.0000 | ORAL_TABLET | Freq: Four times a day (QID) | ORAL | 0 refills | Status: DC | PRN
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
## 2022-10-14 NOTE — Addendum Note (Signed)
Addended by: Meyer Cory on: 10/14/2022 11:32 AM   Modules accepted: Orders

## 2022-10-18 ENCOUNTER — Other Ambulatory Visit: Payer: Self-pay | Admitting: Orthopaedic Surgery

## 2022-10-18 ENCOUNTER — Telehealth: Payer: Self-pay | Admitting: Orthopaedic Surgery

## 2022-10-18 MED ORDER — TRAMADOL HCL 50 MG PO TABS: 50.0000 mg | ORAL_TABLET | Freq: Four times a day (QID) | ORAL | 0 refills | Status: DC | PRN

## 2022-10-18 NOTE — Telephone Encounter (Signed)
noted 

## 2022-10-18 NOTE — Telephone Encounter (Signed)
Patient states that she is unable to take tylenol for more than a couple of days without having diarrhea. She has tried taking the hydrocodone and had to take 4 Immodiums to be able to stop going to the bathroom. Is there something else that you can prescribe?

## 2022-10-18 NOTE — Telephone Encounter (Signed)
Patient states she is unable to take the pain medication due to Bowel issues. Please call patient

## 2022-10-18 NOTE — Telephone Encounter (Signed)
I left voicemail for patient on home and mobile phones requesting return call.

## 2022-11-02 ENCOUNTER — Telehealth: Payer: Self-pay | Admitting: Radiology

## 2022-11-02 NOTE — Telephone Encounter (Signed)
Chumley, Mink[place], RT She called and would like for you to call her concerning her cast and skin.  Her numbers are [phone] or [phone]    I left voicemail at both numbers returning patient's call.

## 2022-11-03 NOTE — Telephone Encounter (Signed)
noted 

## 2022-11-03 NOTE — Telephone Encounter (Signed)
Patient states that her hands and fingers are so dry that the "skin is just coming off in sheets". She has been using heat, then wet, and then applying cream and covering with a sock. She is also having continued pain and now has pain in her thumb which she states was never there. She is scheduled to come back into the office next week.  Please advise on what you would like for her to do. She states that she will continue to do the regimen she has been doing for the skin either way.   If she does not answer on cell, try on mobile. She was actively driving home.

## 2022-11-05 ENCOUNTER — Other Ambulatory Visit: Payer: Self-pay | Admitting: Orthopaedic Surgery

## 2022-11-05 MED ORDER — TRAMADOL HCL 50 MG PO TABS: 50.0000 mg | ORAL_TABLET | Freq: Four times a day (QID) | ORAL | 0 refills | Status: DC | PRN

## 2022-11-11 ENCOUNTER — Encounter: Payer: Self-pay | Admitting: Orthopaedic Surgery

## 2022-11-11 ENCOUNTER — Other Ambulatory Visit (INDEPENDENT_AMBULATORY_CARE_PROVIDER_SITE_OTHER): Payer: Medicare Other

## 2022-11-11 ENCOUNTER — Ambulatory Visit (INDEPENDENT_AMBULATORY_CARE_PROVIDER_SITE_OTHER): Payer: Medicare Other | Admitting: Orthopaedic Surgery

## 2022-11-11 VITALS — Ht 61.0 in | Wt 179.0 lb

## 2022-11-11 DIAGNOSIS — S52532D Colles' fracture of left radius, subsequent encounter for closed fracture with routine healing: Secondary | ICD-10-CM
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
## 2022-11-15 ENCOUNTER — Other Ambulatory Visit: Payer: Self-pay | Admitting: Orthopaedic Surgery

## 2022-11-16 ENCOUNTER — Other Ambulatory Visit: Payer: Self-pay | Admitting: Orthopaedic Surgery

## 2022-11-16 MED ORDER — TRAMADOL HCL 50 MG PO TABS: 50.0000 mg | ORAL_TABLET | Freq: Four times a day (QID) | ORAL | 0 refills | Status: DC | PRN

## 2022-11-18 ENCOUNTER — Encounter: Payer: Self-pay | Admitting: Orthopaedic Surgery

## 2022-11-18 DIAGNOSIS — M8580 Other specified disorders of bone density and structure, unspecified site: Secondary | ICD-10-CM | POA: Insufficient documentation

## 2022-12-02 ENCOUNTER — Encounter: Payer: Self-pay | Admitting: Orthopaedic Surgery

## 2022-12-02 ENCOUNTER — Ambulatory Visit (INDEPENDENT_AMBULATORY_CARE_PROVIDER_SITE_OTHER): Payer: Medicare Other | Admitting: Orthopaedic Surgery

## 2022-12-02 ENCOUNTER — Other Ambulatory Visit (INDEPENDENT_AMBULATORY_CARE_PROVIDER_SITE_OTHER): Payer: Medicare Other

## 2022-12-02 VITALS — Ht 61.0 in | Wt 179.0 lb

## 2022-12-02 DIAGNOSIS — S52532D Colles' fracture of left radius, subsequent encounter for closed fracture with routine healing: Secondary | ICD-10-CM

## 2022-12-02 MED ORDER — ALENDRONATE SODIUM 70 MG PO TABS: 70.0000 mg | ORAL_TABLET | ORAL | 6 refills | Status: AC

## 2022-12-02 NOTE — Addendum Note (Signed)
Addended by: Rogers Seeds on: 12/02/2022 10:04 AM   Modules accepted: Orders
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
## 2022-12-03 ENCOUNTER — Other Ambulatory Visit: Payer: Self-pay | Admitting: Orthopaedic Surgery

## 2022-12-03 MED ORDER — TRAMADOL HCL 50 MG PO TABS: 50.0000 mg | ORAL_TABLET | Freq: Four times a day (QID) | ORAL | 0 refills | Status: AC | PRN

## 2022-12-08 DIAGNOSIS — M25642 Stiffness of left hand, not elsewhere classified: Secondary | ICD-10-CM | POA: Diagnosis not present

## 2022-12-08 DIAGNOSIS — R202 Paresthesia of skin: Secondary | ICD-10-CM | POA: Diagnosis not present

## 2022-12-08 DIAGNOSIS — M79642 Pain in left hand: Secondary | ICD-10-CM | POA: Diagnosis not present

## 2022-12-08 DIAGNOSIS — M25532 Pain in left wrist: Secondary | ICD-10-CM | POA: Diagnosis not present

## 2022-12-08 DIAGNOSIS — M25442 Effusion, left hand: Secondary | ICD-10-CM | POA: Diagnosis not present

## 2022-12-08 DIAGNOSIS — S52532D Colles' fracture of left radius, subsequent encounter for closed fracture with routine healing: Secondary | ICD-10-CM | POA: Diagnosis not present

## 2022-12-08 DIAGNOSIS — R531 Weakness: Secondary | ICD-10-CM | POA: Diagnosis not present

## 2022-12-08 DIAGNOSIS — M25632 Stiffness of left wrist, not elsewhere classified: Secondary | ICD-10-CM | POA: Diagnosis not present

## 2022-12-08 DIAGNOSIS — M25432 Effusion, left wrist: Secondary | ICD-10-CM | POA: Diagnosis not present

## 2022-12-13 DIAGNOSIS — S52532D Colles' fracture of left radius, subsequent encounter for closed fracture with routine healing: Secondary | ICD-10-CM | POA: Diagnosis not present

## 2022-12-13 DIAGNOSIS — M25432 Effusion, left wrist: Secondary | ICD-10-CM | POA: Diagnosis not present

## 2022-12-13 DIAGNOSIS — M25642 Stiffness of left hand, not elsewhere classified: Secondary | ICD-10-CM | POA: Diagnosis not present

## 2022-12-13 DIAGNOSIS — M79642 Pain in left hand: Secondary | ICD-10-CM | POA: Diagnosis not present

## 2022-12-13 DIAGNOSIS — R531 Weakness: Secondary | ICD-10-CM | POA: Diagnosis not present

## 2022-12-13 DIAGNOSIS — M25532 Pain in left wrist: Secondary | ICD-10-CM | POA: Diagnosis not present

## 2022-12-13 DIAGNOSIS — R202 Paresthesia of skin: Secondary | ICD-10-CM | POA: Diagnosis not present

## 2022-12-13 DIAGNOSIS — M25632 Stiffness of left wrist, not elsewhere classified: Secondary | ICD-10-CM | POA: Diagnosis not present

## 2022-12-13 DIAGNOSIS — M25442 Effusion, left hand: Secondary | ICD-10-CM | POA: Diagnosis not present

## 2022-12-16 DIAGNOSIS — S52532D Colles' fracture of left radius, subsequent encounter for closed fracture with routine healing: Secondary | ICD-10-CM | POA: Diagnosis not present

## 2022-12-16 DIAGNOSIS — M5459 Other low back pain: Secondary | ICD-10-CM | POA: Diagnosis not present

## 2022-12-16 DIAGNOSIS — N3942 Incontinence without sensory awareness: Secondary | ICD-10-CM | POA: Diagnosis not present

## 2022-12-16 DIAGNOSIS — M25642 Stiffness of left hand, not elsewhere classified: Secondary | ICD-10-CM | POA: Diagnosis not present

## 2022-12-16 DIAGNOSIS — M79642 Pain in left hand: Secondary | ICD-10-CM | POA: Diagnosis not present

## 2022-12-16 DIAGNOSIS — K580 Irritable bowel syndrome with diarrhea: Secondary | ICD-10-CM | POA: Diagnosis not present

## 2022-12-16 DIAGNOSIS — R202 Paresthesia of skin: Secondary | ICD-10-CM | POA: Diagnosis not present

## 2022-12-16 DIAGNOSIS — M25632 Stiffness of left wrist, not elsewhere classified: Secondary | ICD-10-CM | POA: Diagnosis not present

## 2022-12-16 DIAGNOSIS — M25532 Pain in left wrist: Secondary | ICD-10-CM | POA: Diagnosis not present

## 2022-12-16 DIAGNOSIS — S52562S Barton's fracture of left radius, sequela: Secondary | ICD-10-CM | POA: Diagnosis not present

## 2022-12-16 DIAGNOSIS — R531 Weakness: Secondary | ICD-10-CM | POA: Diagnosis not present

## 2022-12-16 DIAGNOSIS — M25432 Effusion, left wrist: Secondary | ICD-10-CM | POA: Diagnosis not present

## 2022-12-16 DIAGNOSIS — M25442 Effusion, left hand: Secondary | ICD-10-CM | POA: Diagnosis not present

## 2022-12-16 DIAGNOSIS — I10 Essential (primary) hypertension: Secondary | ICD-10-CM | POA: Diagnosis not present

## 2022-12-16 DIAGNOSIS — M5431 Sciatica, right side: Secondary | ICD-10-CM | POA: Diagnosis not present

## 2022-12-20 DIAGNOSIS — M25642 Stiffness of left hand, not elsewhere classified: Secondary | ICD-10-CM | POA: Diagnosis not present

## 2022-12-20 DIAGNOSIS — M25442 Effusion, left hand: Secondary | ICD-10-CM | POA: Diagnosis not present

## 2022-12-20 DIAGNOSIS — M25632 Stiffness of left wrist, not elsewhere classified: Secondary | ICD-10-CM | POA: Diagnosis not present

## 2022-12-20 DIAGNOSIS — S52532D Colles' fracture of left radius, subsequent encounter for closed fracture with routine healing: Secondary | ICD-10-CM | POA: Diagnosis not present

## 2022-12-20 DIAGNOSIS — M25432 Effusion, left wrist: Secondary | ICD-10-CM | POA: Diagnosis not present

## 2022-12-20 DIAGNOSIS — R202 Paresthesia of skin: Secondary | ICD-10-CM | POA: Diagnosis not present

## 2022-12-20 DIAGNOSIS — M79642 Pain in left hand: Secondary | ICD-10-CM | POA: Diagnosis not present

## 2022-12-20 DIAGNOSIS — R531 Weakness: Secondary | ICD-10-CM | POA: Diagnosis not present

## 2022-12-20 DIAGNOSIS — M25532 Pain in left wrist: Secondary | ICD-10-CM | POA: Diagnosis not present

## 2022-12-23 DIAGNOSIS — R531 Weakness: Secondary | ICD-10-CM | POA: Diagnosis not present

## 2022-12-23 DIAGNOSIS — M25532 Pain in left wrist: Secondary | ICD-10-CM | POA: Diagnosis not present

## 2022-12-23 DIAGNOSIS — S52532D Colles' fracture of left radius, subsequent encounter for closed fracture with routine healing: Secondary | ICD-10-CM | POA: Diagnosis not present

## 2022-12-23 DIAGNOSIS — R202 Paresthesia of skin: Secondary | ICD-10-CM | POA: Diagnosis not present

## 2022-12-23 DIAGNOSIS — M25632 Stiffness of left wrist, not elsewhere classified: Secondary | ICD-10-CM | POA: Diagnosis not present

## 2022-12-23 DIAGNOSIS — M79642 Pain in left hand: Secondary | ICD-10-CM | POA: Diagnosis not present

## 2022-12-23 DIAGNOSIS — M25442 Effusion, left hand: Secondary | ICD-10-CM | POA: Diagnosis not present

## 2022-12-23 DIAGNOSIS — M25642 Stiffness of left hand, not elsewhere classified: Secondary | ICD-10-CM | POA: Diagnosis not present

## 2022-12-23 DIAGNOSIS — M25432 Effusion, left wrist: Secondary | ICD-10-CM | POA: Diagnosis not present

## 2022-12-27 DIAGNOSIS — M25532 Pain in left wrist: Secondary | ICD-10-CM | POA: Diagnosis not present

## 2022-12-27 DIAGNOSIS — M79642 Pain in left hand: Secondary | ICD-10-CM | POA: Diagnosis not present

## 2022-12-27 DIAGNOSIS — R202 Paresthesia of skin: Secondary | ICD-10-CM | POA: Diagnosis not present

## 2022-12-27 DIAGNOSIS — M25642 Stiffness of left hand, not elsewhere classified: Secondary | ICD-10-CM | POA: Diagnosis not present

## 2022-12-27 DIAGNOSIS — M25632 Stiffness of left wrist, not elsewhere classified: Secondary | ICD-10-CM | POA: Diagnosis not present

## 2022-12-27 DIAGNOSIS — M25442 Effusion, left hand: Secondary | ICD-10-CM | POA: Diagnosis not present

## 2022-12-27 DIAGNOSIS — R531 Weakness: Secondary | ICD-10-CM | POA: Diagnosis not present

## 2022-12-27 DIAGNOSIS — S52532D Colles' fracture of left radius, subsequent encounter for closed fracture with routine healing: Secondary | ICD-10-CM | POA: Diagnosis not present

## 2022-12-27 DIAGNOSIS — M25432 Effusion, left wrist: Secondary | ICD-10-CM | POA: Diagnosis not present

## 2022-12-30 ENCOUNTER — Encounter: Payer: Self-pay | Admitting: Orthopaedic Surgery

## 2022-12-30 ENCOUNTER — Ambulatory Visit (INDEPENDENT_AMBULATORY_CARE_PROVIDER_SITE_OTHER): Payer: Medicare Other | Admitting: Orthopaedic Surgery

## 2022-12-30 VITALS — Ht 61.0 in | Wt 179.0 lb

## 2022-12-30 DIAGNOSIS — S52532A Colles' fracture of left radius, initial encounter for closed fracture: Secondary | ICD-10-CM | POA: Diagnosis not present

## 2022-12-30 DIAGNOSIS — M25642 Stiffness of left hand, not elsewhere classified: Secondary | ICD-10-CM
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
## 2022-12-31 DIAGNOSIS — M79642 Pain in left hand: Secondary | ICD-10-CM | POA: Diagnosis not present

## 2022-12-31 DIAGNOSIS — R531 Weakness: Secondary | ICD-10-CM | POA: Diagnosis not present

## 2022-12-31 DIAGNOSIS — M25432 Effusion, left wrist: Secondary | ICD-10-CM | POA: Diagnosis not present

## 2022-12-31 DIAGNOSIS — S52532D Colles' fracture of left radius, subsequent encounter for closed fracture with routine healing: Secondary | ICD-10-CM | POA: Diagnosis not present

## 2022-12-31 DIAGNOSIS — R202 Paresthesia of skin: Secondary | ICD-10-CM | POA: Diagnosis not present

## 2022-12-31 DIAGNOSIS — M25532 Pain in left wrist: Secondary | ICD-10-CM | POA: Diagnosis not present

## 2022-12-31 DIAGNOSIS — M25642 Stiffness of left hand, not elsewhere classified: Secondary | ICD-10-CM | POA: Diagnosis not present

## 2022-12-31 DIAGNOSIS — M25442 Effusion, left hand: Secondary | ICD-10-CM | POA: Diagnosis not present

## 2022-12-31 DIAGNOSIS — M25632 Stiffness of left wrist, not elsewhere classified: Secondary | ICD-10-CM | POA: Diagnosis not present

## 2023-01-03 DIAGNOSIS — M25442 Effusion, left hand: Secondary | ICD-10-CM | POA: Diagnosis not present

## 2023-01-03 DIAGNOSIS — R202 Paresthesia of skin: Secondary | ICD-10-CM | POA: Diagnosis not present

## 2023-01-03 DIAGNOSIS — M25642 Stiffness of left hand, not elsewhere classified: Secondary | ICD-10-CM | POA: Diagnosis not present

## 2023-01-03 DIAGNOSIS — M25432 Effusion, left wrist: Secondary | ICD-10-CM | POA: Diagnosis not present

## 2023-01-03 DIAGNOSIS — M79642 Pain in left hand: Secondary | ICD-10-CM | POA: Diagnosis not present

## 2023-01-03 DIAGNOSIS — M25632 Stiffness of left wrist, not elsewhere classified: Secondary | ICD-10-CM | POA: Diagnosis not present

## 2023-01-03 DIAGNOSIS — M25532 Pain in left wrist: Secondary | ICD-10-CM | POA: Diagnosis not present

## 2023-01-03 DIAGNOSIS — R531 Weakness: Secondary | ICD-10-CM | POA: Diagnosis not present

## 2023-01-03 DIAGNOSIS — S52532D Colles' fracture of left radius, subsequent encounter for closed fracture with routine healing: Secondary | ICD-10-CM | POA: Diagnosis not present

## 2023-01-07 DIAGNOSIS — M25532 Pain in left wrist: Secondary | ICD-10-CM | POA: Diagnosis not present

## 2023-01-07 DIAGNOSIS — M25442 Effusion, left hand: Secondary | ICD-10-CM | POA: Diagnosis not present

## 2023-01-07 DIAGNOSIS — M25632 Stiffness of left wrist, not elsewhere classified: Secondary | ICD-10-CM | POA: Diagnosis not present

## 2023-01-07 DIAGNOSIS — M25432 Effusion, left wrist: Secondary | ICD-10-CM | POA: Diagnosis not present

## 2023-01-07 DIAGNOSIS — R531 Weakness: Secondary | ICD-10-CM | POA: Diagnosis not present

## 2023-01-07 DIAGNOSIS — R202 Paresthesia of skin: Secondary | ICD-10-CM | POA: Diagnosis not present

## 2023-01-07 DIAGNOSIS — M25642 Stiffness of left hand, not elsewhere classified: Secondary | ICD-10-CM | POA: Diagnosis not present

## 2023-01-07 DIAGNOSIS — M79642 Pain in left hand: Secondary | ICD-10-CM | POA: Diagnosis not present

## 2023-01-07 DIAGNOSIS — S52532D Colles' fracture of left radius, subsequent encounter for closed fracture with routine healing: Secondary | ICD-10-CM | POA: Diagnosis not present

## 2023-01-12 DIAGNOSIS — M79642 Pain in left hand: Secondary | ICD-10-CM | POA: Diagnosis not present

## 2023-01-12 DIAGNOSIS — M25642 Stiffness of left hand, not elsewhere classified: Secondary | ICD-10-CM | POA: Diagnosis not present

## 2023-01-12 DIAGNOSIS — M25632 Stiffness of left wrist, not elsewhere classified: Secondary | ICD-10-CM | POA: Diagnosis not present

## 2023-01-12 DIAGNOSIS — R202 Paresthesia of skin: Secondary | ICD-10-CM | POA: Diagnosis not present

## 2023-01-12 DIAGNOSIS — M25432 Effusion, left wrist: Secondary | ICD-10-CM | POA: Diagnosis not present

## 2023-01-12 DIAGNOSIS — M25442 Effusion, left hand: Secondary | ICD-10-CM | POA: Diagnosis not present

## 2023-01-12 DIAGNOSIS — R531 Weakness: Secondary | ICD-10-CM | POA: Diagnosis not present

## 2023-01-12 DIAGNOSIS — M25532 Pain in left wrist: Secondary | ICD-10-CM | POA: Diagnosis not present

## 2023-01-12 DIAGNOSIS — S52532D Colles' fracture of left radius, subsequent encounter for closed fracture with routine healing: Secondary | ICD-10-CM | POA: Diagnosis not present

## 2023-01-14 DIAGNOSIS — M25442 Effusion, left hand: Secondary | ICD-10-CM | POA: Diagnosis not present

## 2023-01-14 DIAGNOSIS — M79642 Pain in left hand: Secondary | ICD-10-CM | POA: Diagnosis not present

## 2023-01-14 DIAGNOSIS — M25632 Stiffness of left wrist, not elsewhere classified: Secondary | ICD-10-CM | POA: Diagnosis not present

## 2023-01-14 DIAGNOSIS — R531 Weakness: Secondary | ICD-10-CM | POA: Diagnosis not present

## 2023-01-14 DIAGNOSIS — R202 Paresthesia of skin: Secondary | ICD-10-CM | POA: Diagnosis not present

## 2023-01-14 DIAGNOSIS — M25642 Stiffness of left hand, not elsewhere classified: Secondary | ICD-10-CM | POA: Diagnosis not present

## 2023-01-14 DIAGNOSIS — S52532D Colles' fracture of left radius, subsequent encounter for closed fracture with routine healing: Secondary | ICD-10-CM | POA: Diagnosis not present

## 2023-01-14 DIAGNOSIS — M25432 Effusion, left wrist: Secondary | ICD-10-CM | POA: Diagnosis not present

## 2023-01-14 DIAGNOSIS — M25532 Pain in left wrist: Secondary | ICD-10-CM | POA: Diagnosis not present

## 2023-01-17 DIAGNOSIS — M25532 Pain in left wrist: Secondary | ICD-10-CM | POA: Diagnosis not present

## 2023-01-17 DIAGNOSIS — S52532D Colles' fracture of left radius, subsequent encounter for closed fracture with routine healing: Secondary | ICD-10-CM | POA: Diagnosis not present

## 2023-01-17 DIAGNOSIS — R531 Weakness: Secondary | ICD-10-CM | POA: Diagnosis not present

## 2023-01-17 DIAGNOSIS — M25432 Effusion, left wrist: Secondary | ICD-10-CM | POA: Diagnosis not present

## 2023-01-17 DIAGNOSIS — R202 Paresthesia of skin: Secondary | ICD-10-CM | POA: Diagnosis not present

## 2023-01-17 DIAGNOSIS — M25442 Effusion, left hand: Secondary | ICD-10-CM | POA: Diagnosis not present

## 2023-01-17 DIAGNOSIS — M79642 Pain in left hand: Secondary | ICD-10-CM | POA: Diagnosis not present

## 2023-01-17 DIAGNOSIS — M25632 Stiffness of left wrist, not elsewhere classified: Secondary | ICD-10-CM | POA: Diagnosis not present

## 2023-01-17 DIAGNOSIS — M25642 Stiffness of left hand, not elsewhere classified: Secondary | ICD-10-CM | POA: Diagnosis not present

## 2023-01-19 DIAGNOSIS — M79642 Pain in left hand: Secondary | ICD-10-CM | POA: Diagnosis not present

## 2023-01-19 DIAGNOSIS — R531 Weakness: Secondary | ICD-10-CM | POA: Diagnosis not present

## 2023-01-19 DIAGNOSIS — R202 Paresthesia of skin: Secondary | ICD-10-CM | POA: Diagnosis not present

## 2023-01-19 DIAGNOSIS — M25432 Effusion, left wrist: Secondary | ICD-10-CM | POA: Diagnosis not present

## 2023-01-19 DIAGNOSIS — M25442 Effusion, left hand: Secondary | ICD-10-CM | POA: Diagnosis not present

## 2023-01-19 DIAGNOSIS — S52532D Colles' fracture of left radius, subsequent encounter for closed fracture with routine healing: Secondary | ICD-10-CM | POA: Diagnosis not present

## 2023-01-19 DIAGNOSIS — M25632 Stiffness of left wrist, not elsewhere classified: Secondary | ICD-10-CM | POA: Diagnosis not present

## 2023-01-19 DIAGNOSIS — M25642 Stiffness of left hand, not elsewhere classified: Secondary | ICD-10-CM | POA: Diagnosis not present

## 2023-01-19 DIAGNOSIS — M25532 Pain in left wrist: Secondary | ICD-10-CM | POA: Diagnosis not present

## 2023-01-24 DIAGNOSIS — R202 Paresthesia of skin: Secondary | ICD-10-CM | POA: Diagnosis not present

## 2023-01-24 DIAGNOSIS — M25432 Effusion, left wrist: Secondary | ICD-10-CM | POA: Diagnosis not present

## 2023-01-24 DIAGNOSIS — M25632 Stiffness of left wrist, not elsewhere classified: Secondary | ICD-10-CM | POA: Diagnosis not present

## 2023-01-24 DIAGNOSIS — S52532D Colles' fracture of left radius, subsequent encounter for closed fracture with routine healing: Secondary | ICD-10-CM | POA: Diagnosis not present

## 2023-01-24 DIAGNOSIS — R531 Weakness: Secondary | ICD-10-CM | POA: Diagnosis not present

## 2023-01-24 DIAGNOSIS — M79642 Pain in left hand: Secondary | ICD-10-CM | POA: Diagnosis not present

## 2023-01-24 DIAGNOSIS — M25642 Stiffness of left hand, not elsewhere classified: Secondary | ICD-10-CM | POA: Diagnosis not present

## 2023-01-24 DIAGNOSIS — M25532 Pain in left wrist: Secondary | ICD-10-CM | POA: Diagnosis not present

## 2023-01-24 DIAGNOSIS — M25442 Effusion, left hand: Secondary | ICD-10-CM | POA: Diagnosis not present

## 2023-01-28 DIAGNOSIS — R531 Weakness: Secondary | ICD-10-CM | POA: Diagnosis not present

## 2023-01-28 DIAGNOSIS — M25532 Pain in left wrist: Secondary | ICD-10-CM | POA: Diagnosis not present

## 2023-01-28 DIAGNOSIS — M79642 Pain in left hand: Secondary | ICD-10-CM | POA: Diagnosis not present

## 2023-01-28 DIAGNOSIS — S52532D Colles' fracture of left radius, subsequent encounter for closed fracture with routine healing: Secondary | ICD-10-CM | POA: Diagnosis not present

## 2023-01-28 DIAGNOSIS — M25432 Effusion, left wrist: Secondary | ICD-10-CM | POA: Diagnosis not present

## 2023-01-28 DIAGNOSIS — R202 Paresthesia of skin: Secondary | ICD-10-CM | POA: Diagnosis not present

## 2023-01-28 DIAGNOSIS — M25442 Effusion, left hand: Secondary | ICD-10-CM | POA: Diagnosis not present

## 2023-01-28 DIAGNOSIS — M25632 Stiffness of left wrist, not elsewhere classified: Secondary | ICD-10-CM | POA: Diagnosis not present

## 2023-01-28 DIAGNOSIS — M25642 Stiffness of left hand, not elsewhere classified: Secondary | ICD-10-CM | POA: Diagnosis not present

## 2023-01-31 DIAGNOSIS — M79642 Pain in left hand: Secondary | ICD-10-CM | POA: Diagnosis not present

## 2023-01-31 DIAGNOSIS — M25432 Effusion, left wrist: Secondary | ICD-10-CM | POA: Diagnosis not present

## 2023-01-31 DIAGNOSIS — R202 Paresthesia of skin: Secondary | ICD-10-CM | POA: Diagnosis not present

## 2023-01-31 DIAGNOSIS — R531 Weakness: Secondary | ICD-10-CM | POA: Diagnosis not present

## 2023-01-31 DIAGNOSIS — M25532 Pain in left wrist: Secondary | ICD-10-CM | POA: Diagnosis not present

## 2023-01-31 DIAGNOSIS — M25632 Stiffness of left wrist, not elsewhere classified: Secondary | ICD-10-CM | POA: Diagnosis not present

## 2023-01-31 DIAGNOSIS — M25642 Stiffness of left hand, not elsewhere classified: Secondary | ICD-10-CM | POA: Diagnosis not present

## 2023-01-31 DIAGNOSIS — S52532D Colles' fracture of left radius, subsequent encounter for closed fracture with routine healing: Secondary | ICD-10-CM | POA: Diagnosis not present

## 2023-01-31 DIAGNOSIS — M25442 Effusion, left hand: Secondary | ICD-10-CM | POA: Diagnosis not present

## 2023-02-04 DIAGNOSIS — M25532 Pain in left wrist: Secondary | ICD-10-CM | POA: Diagnosis not present

## 2023-02-04 DIAGNOSIS — M25642 Stiffness of left hand, not elsewhere classified: Secondary | ICD-10-CM | POA: Diagnosis not present

## 2023-02-04 DIAGNOSIS — R202 Paresthesia of skin: Secondary | ICD-10-CM | POA: Diagnosis not present

## 2023-02-04 DIAGNOSIS — R531 Weakness: Secondary | ICD-10-CM | POA: Diagnosis not present

## 2023-02-04 DIAGNOSIS — M79642 Pain in left hand: Secondary | ICD-10-CM | POA: Diagnosis not present

## 2023-02-04 DIAGNOSIS — M25442 Effusion, left hand: Secondary | ICD-10-CM | POA: Diagnosis not present

## 2023-02-04 DIAGNOSIS — M25432 Effusion, left wrist: Secondary | ICD-10-CM | POA: Diagnosis not present

## 2023-02-04 DIAGNOSIS — M25632 Stiffness of left wrist, not elsewhere classified: Secondary | ICD-10-CM | POA: Diagnosis not present

## 2023-02-04 DIAGNOSIS — S52532D Colles' fracture of left radius, subsequent encounter for closed fracture with routine healing: Secondary | ICD-10-CM | POA: Diagnosis not present

## 2023-02-07 DIAGNOSIS — M25442 Effusion, left hand: Secondary | ICD-10-CM | POA: Diagnosis not present

## 2023-02-07 DIAGNOSIS — S52532D Colles' fracture of left radius, subsequent encounter for closed fracture with routine healing: Secondary | ICD-10-CM | POA: Diagnosis not present

## 2023-02-07 DIAGNOSIS — M25632 Stiffness of left wrist, not elsewhere classified: Secondary | ICD-10-CM | POA: Diagnosis not present

## 2023-02-07 DIAGNOSIS — R531 Weakness: Secondary | ICD-10-CM | POA: Diagnosis not present

## 2023-02-07 DIAGNOSIS — M25432 Effusion, left wrist: Secondary | ICD-10-CM | POA: Diagnosis not present

## 2023-02-07 DIAGNOSIS — M79642 Pain in left hand: Secondary | ICD-10-CM | POA: Diagnosis not present

## 2023-02-07 DIAGNOSIS — M25642 Stiffness of left hand, not elsewhere classified: Secondary | ICD-10-CM | POA: Diagnosis not present

## 2023-02-07 DIAGNOSIS — R202 Paresthesia of skin: Secondary | ICD-10-CM | POA: Diagnosis not present

## 2023-02-07 DIAGNOSIS — M25532 Pain in left wrist: Secondary | ICD-10-CM | POA: Diagnosis not present

## 2023-02-10 ENCOUNTER — Ambulatory Visit (INDEPENDENT_AMBULATORY_CARE_PROVIDER_SITE_OTHER): Payer: Medicare Other | Admitting: Orthopaedic Surgery

## 2023-02-10 ENCOUNTER — Encounter: Payer: Self-pay | Admitting: Orthopaedic Surgery

## 2023-02-10 VITALS — Ht 61.0 in | Wt 179.0 lb

## 2023-02-10 DIAGNOSIS — S52532S Colles' fracture of left radius, sequela: Secondary | ICD-10-CM
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
## 2023-02-11 DIAGNOSIS — S52532D Colles' fracture of left radius, subsequent encounter for closed fracture with routine healing: Secondary | ICD-10-CM | POA: Diagnosis not present

## 2023-02-11 DIAGNOSIS — M25532 Pain in left wrist: Secondary | ICD-10-CM | POA: Diagnosis not present

## 2023-02-11 DIAGNOSIS — M79642 Pain in left hand: Secondary | ICD-10-CM | POA: Diagnosis not present

## 2023-02-11 DIAGNOSIS — R531 Weakness: Secondary | ICD-10-CM | POA: Diagnosis not present

## 2023-02-11 DIAGNOSIS — M25632 Stiffness of left wrist, not elsewhere classified: Secondary | ICD-10-CM | POA: Diagnosis not present

## 2023-02-11 DIAGNOSIS — R202 Paresthesia of skin: Secondary | ICD-10-CM | POA: Diagnosis not present

## 2023-02-11 DIAGNOSIS — M25432 Effusion, left wrist: Secondary | ICD-10-CM | POA: Diagnosis not present

## 2023-02-11 DIAGNOSIS — M25642 Stiffness of left hand, not elsewhere classified: Secondary | ICD-10-CM | POA: Diagnosis not present

## 2023-02-11 DIAGNOSIS — M25442 Effusion, left hand: Secondary | ICD-10-CM | POA: Diagnosis not present

## 2023-02-14 DIAGNOSIS — M25632 Stiffness of left wrist, not elsewhere classified: Secondary | ICD-10-CM | POA: Diagnosis not present

## 2023-02-14 DIAGNOSIS — M25532 Pain in left wrist: Secondary | ICD-10-CM | POA: Diagnosis not present

## 2023-02-14 DIAGNOSIS — R202 Paresthesia of skin: Secondary | ICD-10-CM | POA: Diagnosis not present

## 2023-02-14 DIAGNOSIS — M25432 Effusion, left wrist: Secondary | ICD-10-CM | POA: Diagnosis not present

## 2023-02-14 DIAGNOSIS — M79642 Pain in left hand: Secondary | ICD-10-CM | POA: Diagnosis not present

## 2023-02-14 DIAGNOSIS — S52532D Colles' fracture of left radius, subsequent encounter for closed fracture with routine healing: Secondary | ICD-10-CM | POA: Diagnosis not present

## 2023-02-14 DIAGNOSIS — R531 Weakness: Secondary | ICD-10-CM | POA: Diagnosis not present

## 2023-02-14 DIAGNOSIS — M25642 Stiffness of left hand, not elsewhere classified: Secondary | ICD-10-CM | POA: Diagnosis not present

## 2023-02-14 DIAGNOSIS — M25442 Effusion, left hand: Secondary | ICD-10-CM | POA: Diagnosis not present

## 2023-02-18 DIAGNOSIS — R531 Weakness: Secondary | ICD-10-CM | POA: Diagnosis not present

## 2023-02-18 DIAGNOSIS — M25532 Pain in left wrist: Secondary | ICD-10-CM | POA: Diagnosis not present

## 2023-02-18 DIAGNOSIS — M79642 Pain in left hand: Secondary | ICD-10-CM | POA: Diagnosis not present

## 2023-02-18 DIAGNOSIS — M25432 Effusion, left wrist: Secondary | ICD-10-CM | POA: Diagnosis not present

## 2023-02-18 DIAGNOSIS — M25442 Effusion, left hand: Secondary | ICD-10-CM | POA: Diagnosis not present

## 2023-02-18 DIAGNOSIS — S52532D Colles' fracture of left radius, subsequent encounter for closed fracture with routine healing: Secondary | ICD-10-CM | POA: Diagnosis not present

## 2023-02-18 DIAGNOSIS — R202 Paresthesia of skin: Secondary | ICD-10-CM | POA: Diagnosis not present

## 2023-02-18 DIAGNOSIS — M25642 Stiffness of left hand, not elsewhere classified: Secondary | ICD-10-CM | POA: Diagnosis not present

## 2023-02-18 DIAGNOSIS — M25632 Stiffness of left wrist, not elsewhere classified: Secondary | ICD-10-CM | POA: Diagnosis not present

## 2023-03-21 DIAGNOSIS — I10 Essential (primary) hypertension: Secondary | ICD-10-CM | POA: Diagnosis not present

## 2023-03-21 DIAGNOSIS — N3942 Incontinence without sensory awareness: Secondary | ICD-10-CM | POA: Diagnosis not present

## 2023-03-21 DIAGNOSIS — M5459 Other low back pain: Secondary | ICD-10-CM | POA: Diagnosis not present

## 2023-03-21 DIAGNOSIS — S52562S Barton's fracture of left radius, sequela: Secondary | ICD-10-CM | POA: Diagnosis not present

## 2023-03-21 DIAGNOSIS — M5431 Sciatica, right side: Secondary | ICD-10-CM | POA: Diagnosis not present

## 2023-03-21 DIAGNOSIS — K580 Irritable bowel syndrome with diarrhea: Secondary | ICD-10-CM | POA: Diagnosis not present

## 2023-05-09 DIAGNOSIS — N182 Chronic kidney disease, stage 2 (mild): Secondary | ICD-10-CM | POA: Diagnosis not present

## 2023-05-09 DIAGNOSIS — I952 Hypotension due to drugs: Secondary | ICD-10-CM | POA: Diagnosis not present

## 2023-06-20 DIAGNOSIS — I10 Essential (primary) hypertension: Secondary | ICD-10-CM | POA: Diagnosis not present

## 2023-06-20 DIAGNOSIS — M5459 Other low back pain: Secondary | ICD-10-CM | POA: Diagnosis not present

## 2023-06-20 DIAGNOSIS — K580 Irritable bowel syndrome with diarrhea: Secondary | ICD-10-CM | POA: Diagnosis not present

## 2023-06-20 DIAGNOSIS — N182 Chronic kidney disease, stage 2 (mild): Secondary | ICD-10-CM | POA: Diagnosis not present

## 2023-06-20 DIAGNOSIS — N3942 Incontinence without sensory awareness: Secondary | ICD-10-CM | POA: Diagnosis not present

## 2023-06-20 DIAGNOSIS — M5431 Sciatica, right side: Secondary | ICD-10-CM | POA: Diagnosis not present

## 2023-08-16 DIAGNOSIS — H5213 Myopia, bilateral: Secondary | ICD-10-CM | POA: Diagnosis not present

## 2023-08-16 DIAGNOSIS — H35463 Secondary vitreoretinal degeneration, bilateral: Secondary | ICD-10-CM | POA: Diagnosis not present

## 2023-09-27 DIAGNOSIS — Z1329 Encounter for screening for other suspected endocrine disorder: Secondary | ICD-10-CM | POA: Diagnosis not present

## 2023-10-10 DIAGNOSIS — M5431 Sciatica, right side: Secondary | ICD-10-CM | POA: Diagnosis not present

## 2023-10-10 DIAGNOSIS — K580 Irritable bowel syndrome with diarrhea: Secondary | ICD-10-CM | POA: Diagnosis not present

## 2023-10-10 DIAGNOSIS — I10 Essential (primary) hypertension: Secondary | ICD-10-CM | POA: Diagnosis not present

## 2023-10-10 DIAGNOSIS — M5459 Other low back pain: Secondary | ICD-10-CM | POA: Diagnosis not present

## 2023-10-10 DIAGNOSIS — N3942 Incontinence without sensory awareness: Secondary | ICD-10-CM | POA: Diagnosis not present

## 2023-10-10 DIAGNOSIS — N182 Chronic kidney disease, stage 2 (mild): Secondary | ICD-10-CM | POA: Diagnosis not present

## 2023-10-14 DIAGNOSIS — I10 Essential (primary) hypertension: Secondary | ICD-10-CM | POA: Diagnosis not present

## 2023-10-14 DIAGNOSIS — K580 Irritable bowel syndrome with diarrhea: Secondary | ICD-10-CM | POA: Diagnosis not present

## 2023-10-14 DIAGNOSIS — N3942 Incontinence without sensory awareness: Secondary | ICD-10-CM | POA: Diagnosis not present

## 2023-10-14 DIAGNOSIS — M5431 Sciatica, right side: Secondary | ICD-10-CM | POA: Diagnosis not present

## 2023-10-14 DIAGNOSIS — N182 Chronic kidney disease, stage 2 (mild): Secondary | ICD-10-CM | POA: Diagnosis not present

## 2023-12-21 DIAGNOSIS — I10 Essential (primary) hypertension: Secondary | ICD-10-CM | POA: Diagnosis not present

## 2023-12-21 DIAGNOSIS — A681 Tick-borne relapsing fever: Secondary | ICD-10-CM | POA: Diagnosis not present

## 2024-01-16 DIAGNOSIS — N182 Chronic kidney disease, stage 2 (mild): Secondary | ICD-10-CM | POA: Diagnosis not present

## 2024-01-16 DIAGNOSIS — J309 Allergic rhinitis, unspecified: Secondary | ICD-10-CM | POA: Diagnosis not present

## 2024-01-16 DIAGNOSIS — J4520 Mild intermittent asthma, uncomplicated: Secondary | ICD-10-CM | POA: Diagnosis not present

## 2024-01-16 DIAGNOSIS — I10 Essential (primary) hypertension: Secondary | ICD-10-CM | POA: Diagnosis not present

## 2024-01-16 DIAGNOSIS — K580 Irritable bowel syndrome with diarrhea: Secondary | ICD-10-CM | POA: Diagnosis not present

## 2024-03-27 DIAGNOSIS — A932 Colorado tick fever: Secondary | ICD-10-CM | POA: Diagnosis not present

## 2024-04-23 DIAGNOSIS — N3942 Incontinence without sensory awareness: Secondary | ICD-10-CM | POA: Diagnosis not present

## 2024-04-23 DIAGNOSIS — K580 Irritable bowel syndrome with diarrhea: Secondary | ICD-10-CM | POA: Diagnosis not present

## 2024-04-23 DIAGNOSIS — I10 Essential (primary) hypertension: Secondary | ICD-10-CM | POA: Diagnosis not present

## 2024-04-23 DIAGNOSIS — J4520 Mild intermittent asthma, uncomplicated: Secondary | ICD-10-CM | POA: Diagnosis not present

## 2024-04-23 DIAGNOSIS — J309 Allergic rhinitis, unspecified: Secondary | ICD-10-CM | POA: Diagnosis not present

## 2024-04-23 DIAGNOSIS — L309 Dermatitis, unspecified: Secondary | ICD-10-CM | POA: Diagnosis not present

## 2024-04-23 DIAGNOSIS — N182 Chronic kidney disease, stage 2 (mild): Secondary | ICD-10-CM | POA: Diagnosis not present

## 2024-04-23 DIAGNOSIS — A932 Colorado tick fever: Secondary | ICD-10-CM | POA: Diagnosis not present

## 2024-05-02 DIAGNOSIS — R0602 Shortness of breath: Secondary | ICD-10-CM | POA: Diagnosis not present

## 2024-05-11 DIAGNOSIS — I10 Essential (primary) hypertension: Secondary | ICD-10-CM | POA: Diagnosis not present

## 2024-05-11 DIAGNOSIS — N182 Chronic kidney disease, stage 2 (mild): Secondary | ICD-10-CM | POA: Diagnosis not present

## 2024-07-31 DIAGNOSIS — N1831 Chronic kidney disease, stage 3a: Secondary | ICD-10-CM | POA: Diagnosis not present

## 2024-07-31 DIAGNOSIS — N3942 Incontinence without sensory awareness: Secondary | ICD-10-CM | POA: Diagnosis not present

## 2024-07-31 DIAGNOSIS — J4520 Mild intermittent asthma, uncomplicated: Secondary | ICD-10-CM | POA: Diagnosis not present

## 2024-07-31 DIAGNOSIS — J309 Allergic rhinitis, unspecified: Secondary | ICD-10-CM | POA: Diagnosis not present

## 2024-07-31 DIAGNOSIS — K580 Irritable bowel syndrome with diarrhea: Secondary | ICD-10-CM | POA: Diagnosis not present

## 2024-07-31 DIAGNOSIS — I10 Essential (primary) hypertension: Secondary | ICD-10-CM | POA: Diagnosis not present
